# Patient Record
Sex: Male | Born: 1945 | Race: White | Hispanic: No | Marital: Married | Smoking: Never smoker
Health system: Southern US, Community
[De-identification: ages and names within clinical notes are randomized; demographics above are authoritative.]

## PROBLEM LIST (undated history)

## (undated) DIAGNOSIS — M419 Scoliosis, unspecified: Secondary | ICD-10-CM

## (undated) DIAGNOSIS — M1990 Unspecified osteoarthritis, unspecified site: Secondary | ICD-10-CM

## (undated) DIAGNOSIS — E0590 Thyrotoxicosis, unspecified without thyrotoxic crisis or storm: Secondary | ICD-10-CM

## (undated) DIAGNOSIS — G4730 Sleep apnea, unspecified: Secondary | ICD-10-CM

## (undated) DIAGNOSIS — E785 Hyperlipidemia, unspecified: Secondary | ICD-10-CM

## (undated) DIAGNOSIS — I10 Essential (primary) hypertension: Secondary | ICD-10-CM

## (undated) DIAGNOSIS — C801 Malignant (primary) neoplasm, unspecified: Secondary | ICD-10-CM

## (undated) DIAGNOSIS — K219 Gastro-esophageal reflux disease without esophagitis: Secondary | ICD-10-CM

## (undated) DIAGNOSIS — H9190 Unspecified hearing loss, unspecified ear: Secondary | ICD-10-CM

## (undated) HISTORY — DX: Hyperlipidemia, unspecified: E78.5

## (undated) HISTORY — DX: Scoliosis, unspecified: M41.9

## (undated) HISTORY — PX: KNEE ARTHROSCOPY: SUR90

## (undated) HISTORY — DX: Essential (primary) hypertension: I10

## (undated) HISTORY — DX: Malignant (primary) neoplasm, unspecified: C80.1

---

## 2000-03-11 HISTORY — PX: PROSTATECTOMY: SHX69

## 2007-08-21 ENCOUNTER — Encounter: Admission: RE | Admit: 2007-08-21 | Discharge: 2007-08-21 | Payer: Self-pay | Admitting: Unknown Physician Specialty

## 2009-10-02 ENCOUNTER — Encounter: Admission: RE | Admit: 2009-10-02 | Discharge: 2009-10-02 | Payer: Self-pay | Admitting: Unknown Physician Specialty

## 2011-04-04 DIAGNOSIS — H109 Unspecified conjunctivitis: Secondary | ICD-10-CM | POA: Diagnosis not present

## 2011-04-04 DIAGNOSIS — R030 Elevated blood-pressure reading, without diagnosis of hypertension: Secondary | ICD-10-CM | POA: Diagnosis not present

## 2011-04-04 DIAGNOSIS — Z23 Encounter for immunization: Secondary | ICD-10-CM | POA: Diagnosis not present

## 2011-05-02 DIAGNOSIS — R05 Cough: Secondary | ICD-10-CM | POA: Diagnosis not present

## 2011-05-02 DIAGNOSIS — J029 Acute pharyngitis, unspecified: Secondary | ICD-10-CM | POA: Diagnosis not present

## 2011-05-02 DIAGNOSIS — J069 Acute upper respiratory infection, unspecified: Secondary | ICD-10-CM | POA: Diagnosis not present

## 2011-07-04 DIAGNOSIS — H2510 Age-related nuclear cataract, unspecified eye: Secondary | ICD-10-CM | POA: Diagnosis not present

## 2011-07-25 DIAGNOSIS — E0590 Thyrotoxicosis, unspecified without thyrotoxic crisis or storm: Secondary | ICD-10-CM | POA: Diagnosis not present

## 2011-12-05 DIAGNOSIS — R05 Cough: Secondary | ICD-10-CM | POA: Diagnosis not present

## 2011-12-05 DIAGNOSIS — J029 Acute pharyngitis, unspecified: Secondary | ICD-10-CM | POA: Diagnosis not present

## 2011-12-05 DIAGNOSIS — Z23 Encounter for immunization: Secondary | ICD-10-CM | POA: Diagnosis not present

## 2012-03-12 DIAGNOSIS — L282 Other prurigo: Secondary | ICD-10-CM | POA: Diagnosis not present

## 2012-03-12 DIAGNOSIS — L259 Unspecified contact dermatitis, unspecified cause: Secondary | ICD-10-CM | POA: Diagnosis not present

## 2012-06-02 DIAGNOSIS — D235 Other benign neoplasm of skin of trunk: Secondary | ICD-10-CM | POA: Diagnosis not present

## 2012-06-02 DIAGNOSIS — L570 Actinic keratosis: Secondary | ICD-10-CM | POA: Diagnosis not present

## 2012-09-01 DIAGNOSIS — IMO0002 Reserved for concepts with insufficient information to code with codable children: Secondary | ICD-10-CM | POA: Diagnosis not present

## 2012-09-04 DIAGNOSIS — IMO0002 Reserved for concepts with insufficient information to code with codable children: Secondary | ICD-10-CM | POA: Diagnosis not present

## 2012-09-15 DIAGNOSIS — IMO0002 Reserved for concepts with insufficient information to code with codable children: Secondary | ICD-10-CM | POA: Diagnosis not present

## 2012-09-18 DIAGNOSIS — IMO0002 Reserved for concepts with insufficient information to code with codable children: Secondary | ICD-10-CM | POA: Diagnosis not present

## 2012-10-05 DIAGNOSIS — E0510 Thyrotoxicosis with toxic single thyroid nodule without thyrotoxic crisis or storm: Secondary | ICD-10-CM | POA: Diagnosis not present

## 2012-10-12 DIAGNOSIS — M719 Bursopathy, unspecified: Secondary | ICD-10-CM | POA: Diagnosis not present

## 2012-10-12 DIAGNOSIS — M159 Polyosteoarthritis, unspecified: Secondary | ICD-10-CM | POA: Diagnosis not present

## 2012-10-12 DIAGNOSIS — I10 Essential (primary) hypertension: Secondary | ICD-10-CM | POA: Diagnosis not present

## 2012-10-12 DIAGNOSIS — M67919 Unspecified disorder of synovium and tendon, unspecified shoulder: Secondary | ICD-10-CM | POA: Diagnosis not present

## 2012-10-12 DIAGNOSIS — K5730 Diverticulosis of large intestine without perforation or abscess without bleeding: Secondary | ICD-10-CM | POA: Diagnosis not present

## 2012-10-12 DIAGNOSIS — M109 Gout, unspecified: Secondary | ICD-10-CM | POA: Diagnosis not present

## 2012-10-12 DIAGNOSIS — M5137 Other intervertebral disc degeneration, lumbosacral region: Secondary | ICD-10-CM | POA: Diagnosis not present

## 2012-10-12 DIAGNOSIS — M48061 Spinal stenosis, lumbar region without neurogenic claudication: Secondary | ICD-10-CM | POA: Diagnosis not present

## 2012-10-12 DIAGNOSIS — Z23 Encounter for immunization: Secondary | ICD-10-CM | POA: Diagnosis not present

## 2012-10-12 DIAGNOSIS — Z800 Family history of malignant neoplasm of digestive organs: Secondary | ICD-10-CM | POA: Diagnosis not present

## 2012-10-12 DIAGNOSIS — M5106 Intervertebral disc disorders with myelopathy, lumbar region: Secondary | ICD-10-CM | POA: Diagnosis not present

## 2012-12-07 DIAGNOSIS — IMO0002 Reserved for concepts with insufficient information to code with codable children: Secondary | ICD-10-CM | POA: Diagnosis not present

## 2013-01-11 DIAGNOSIS — M5106 Intervertebral disc disorders with myelopathy, lumbar region: Secondary | ICD-10-CM | POA: Diagnosis not present

## 2013-01-11 DIAGNOSIS — M5137 Other intervertebral disc degeneration, lumbosacral region: Secondary | ICD-10-CM | POA: Diagnosis not present

## 2013-01-11 DIAGNOSIS — Z23 Encounter for immunization: Secondary | ICD-10-CM | POA: Diagnosis not present

## 2013-01-11 DIAGNOSIS — I10 Essential (primary) hypertension: Secondary | ICD-10-CM | POA: Diagnosis not present

## 2013-01-11 DIAGNOSIS — M67919 Unspecified disorder of synovium and tendon, unspecified shoulder: Secondary | ICD-10-CM | POA: Diagnosis not present

## 2013-01-11 DIAGNOSIS — M159 Polyosteoarthritis, unspecified: Secondary | ICD-10-CM | POA: Diagnosis not present

## 2013-01-11 DIAGNOSIS — M48061 Spinal stenosis, lumbar region without neurogenic claudication: Secondary | ICD-10-CM | POA: Diagnosis not present

## 2013-01-11 DIAGNOSIS — K5730 Diverticulosis of large intestine without perforation or abscess without bleeding: Secondary | ICD-10-CM | POA: Diagnosis not present

## 2013-04-07 DIAGNOSIS — E0510 Thyrotoxicosis with toxic single thyroid nodule without thyrotoxic crisis or storm: Secondary | ICD-10-CM | POA: Diagnosis not present

## 2013-04-13 DIAGNOSIS — E7800 Pure hypercholesterolemia, unspecified: Secondary | ICD-10-CM | POA: Diagnosis not present

## 2013-04-13 DIAGNOSIS — Z800 Family history of malignant neoplasm of digestive organs: Secondary | ICD-10-CM | POA: Diagnosis not present

## 2013-04-13 DIAGNOSIS — R7309 Other abnormal glucose: Secondary | ICD-10-CM | POA: Diagnosis not present

## 2013-04-13 DIAGNOSIS — I10 Essential (primary) hypertension: Secondary | ICD-10-CM | POA: Diagnosis not present

## 2013-04-13 DIAGNOSIS — M5137 Other intervertebral disc degeneration, lumbosacral region: Secondary | ICD-10-CM | POA: Diagnosis not present

## 2013-04-13 DIAGNOSIS — K5730 Diverticulosis of large intestine without perforation or abscess without bleeding: Secondary | ICD-10-CM | POA: Diagnosis not present

## 2013-04-13 DIAGNOSIS — F4310 Post-traumatic stress disorder, unspecified: Secondary | ICD-10-CM | POA: Diagnosis not present

## 2013-04-13 DIAGNOSIS — M48061 Spinal stenosis, lumbar region without neurogenic claudication: Secondary | ICD-10-CM | POA: Diagnosis not present

## 2013-04-13 DIAGNOSIS — M159 Polyosteoarthritis, unspecified: Secondary | ICD-10-CM | POA: Diagnosis not present

## 2013-04-13 DIAGNOSIS — Z125 Encounter for screening for malignant neoplasm of prostate: Secondary | ICD-10-CM | POA: Diagnosis not present

## 2013-05-12 DIAGNOSIS — Z0100 Encounter for examination of eyes and vision without abnormal findings: Secondary | ICD-10-CM | POA: Diagnosis not present

## 2013-05-12 DIAGNOSIS — H2510 Age-related nuclear cataract, unspecified eye: Secondary | ICD-10-CM | POA: Diagnosis not present

## 2013-09-14 DIAGNOSIS — E0510 Thyrotoxicosis with toxic single thyroid nodule without thyrotoxic crisis or storm: Secondary | ICD-10-CM | POA: Diagnosis not present

## 2013-11-05 DIAGNOSIS — K219 Gastro-esophageal reflux disease without esophagitis: Secondary | ICD-10-CM | POA: Diagnosis not present

## 2013-11-05 DIAGNOSIS — J309 Allergic rhinitis, unspecified: Secondary | ICD-10-CM | POA: Diagnosis not present

## 2013-11-05 DIAGNOSIS — E0590 Thyrotoxicosis, unspecified without thyrotoxic crisis or storm: Secondary | ICD-10-CM | POA: Diagnosis not present

## 2013-11-05 DIAGNOSIS — R7309 Other abnormal glucose: Secondary | ICD-10-CM | POA: Diagnosis not present

## 2013-11-05 DIAGNOSIS — C61 Malignant neoplasm of prostate: Secondary | ICD-10-CM | POA: Diagnosis not present

## 2013-11-05 DIAGNOSIS — M109 Gout, unspecified: Secondary | ICD-10-CM | POA: Diagnosis not present

## 2013-11-05 DIAGNOSIS — E0510 Thyrotoxicosis with toxic single thyroid nodule without thyrotoxic crisis or storm: Secondary | ICD-10-CM | POA: Diagnosis not present

## 2013-11-05 DIAGNOSIS — Z23 Encounter for immunization: Secondary | ICD-10-CM | POA: Diagnosis not present

## 2013-11-05 DIAGNOSIS — E7800 Pure hypercholesterolemia, unspecified: Secondary | ICD-10-CM | POA: Diagnosis not present

## 2013-11-05 DIAGNOSIS — I10 Essential (primary) hypertension: Secondary | ICD-10-CM | POA: Diagnosis not present

## 2013-11-09 DIAGNOSIS — L570 Actinic keratosis: Secondary | ICD-10-CM | POA: Diagnosis not present

## 2013-11-09 DIAGNOSIS — D235 Other benign neoplasm of skin of trunk: Secondary | ICD-10-CM | POA: Diagnosis not present

## 2013-11-10 DIAGNOSIS — M19019 Primary osteoarthritis, unspecified shoulder: Secondary | ICD-10-CM | POA: Diagnosis not present

## 2013-11-10 DIAGNOSIS — M25519 Pain in unspecified shoulder: Secondary | ICD-10-CM | POA: Diagnosis not present

## 2013-12-27 DIAGNOSIS — M19011 Primary osteoarthritis, right shoulder: Secondary | ICD-10-CM | POA: Diagnosis not present

## 2013-12-27 DIAGNOSIS — M25511 Pain in right shoulder: Secondary | ICD-10-CM | POA: Diagnosis not present

## 2014-01-09 HISTORY — PX: SHOULDER SURGERY: SHX246

## 2014-01-09 HISTORY — PX: JOINT REPLACEMENT: SHX530

## 2014-01-19 DIAGNOSIS — E669 Obesity, unspecified: Secondary | ICD-10-CM | POA: Diagnosis present

## 2014-01-19 DIAGNOSIS — Z6832 Body mass index (BMI) 32.0-32.9, adult: Secondary | ICD-10-CM | POA: Diagnosis not present

## 2014-01-19 DIAGNOSIS — E0590 Thyrotoxicosis, unspecified without thyrotoxic crisis or storm: Secondary | ICD-10-CM | POA: Diagnosis present

## 2014-01-19 DIAGNOSIS — Z01812 Encounter for preprocedural laboratory examination: Secondary | ICD-10-CM | POA: Diagnosis not present

## 2014-01-19 DIAGNOSIS — Z7982 Long term (current) use of aspirin: Secondary | ICD-10-CM | POA: Diagnosis not present

## 2014-01-19 DIAGNOSIS — K219 Gastro-esophageal reflux disease without esophagitis: Secondary | ICD-10-CM | POA: Diagnosis present

## 2014-01-19 DIAGNOSIS — Z79899 Other long term (current) drug therapy: Secondary | ICD-10-CM | POA: Diagnosis not present

## 2014-01-19 DIAGNOSIS — M1A9XX1 Chronic gout, unspecified, with tophus (tophi): Secondary | ICD-10-CM | POA: Diagnosis present

## 2014-01-19 DIAGNOSIS — I10 Essential (primary) hypertension: Secondary | ICD-10-CM | POA: Diagnosis present

## 2014-01-19 DIAGNOSIS — G4730 Sleep apnea, unspecified: Secondary | ICD-10-CM | POA: Diagnosis not present

## 2014-01-19 DIAGNOSIS — Z471 Aftercare following joint replacement surgery: Secondary | ICD-10-CM | POA: Diagnosis not present

## 2014-01-19 DIAGNOSIS — D62 Acute posthemorrhagic anemia: Secondary | ICD-10-CM | POA: Diagnosis not present

## 2014-01-19 DIAGNOSIS — E785 Hyperlipidemia, unspecified: Secondary | ICD-10-CM | POA: Diagnosis not present

## 2014-01-19 DIAGNOSIS — M25511 Pain in right shoulder: Secondary | ICD-10-CM | POA: Diagnosis not present

## 2014-01-19 DIAGNOSIS — M19011 Primary osteoarthritis, right shoulder: Secondary | ICD-10-CM | POA: Diagnosis not present

## 2014-02-02 DIAGNOSIS — M75121 Complete rotator cuff tear or rupture of right shoulder, not specified as traumatic: Secondary | ICD-10-CM | POA: Diagnosis not present

## 2014-02-02 DIAGNOSIS — M19011 Primary osteoarthritis, right shoulder: Secondary | ICD-10-CM | POA: Diagnosis not present

## 2014-02-08 DIAGNOSIS — M25511 Pain in right shoulder: Secondary | ICD-10-CM | POA: Diagnosis not present

## 2014-02-09 DIAGNOSIS — M19011 Primary osteoarthritis, right shoulder: Secondary | ICD-10-CM | POA: Diagnosis not present

## 2014-02-11 DIAGNOSIS — M75121 Complete rotator cuff tear or rupture of right shoulder, not specified as traumatic: Secondary | ICD-10-CM | POA: Diagnosis not present

## 2014-02-15 DIAGNOSIS — M19011 Primary osteoarthritis, right shoulder: Secondary | ICD-10-CM | POA: Diagnosis not present

## 2014-02-17 DIAGNOSIS — M19011 Primary osteoarthritis, right shoulder: Secondary | ICD-10-CM | POA: Diagnosis not present

## 2014-02-21 DIAGNOSIS — M19011 Primary osteoarthritis, right shoulder: Secondary | ICD-10-CM | POA: Diagnosis not present

## 2014-02-24 DIAGNOSIS — M19011 Primary osteoarthritis, right shoulder: Secondary | ICD-10-CM | POA: Diagnosis not present

## 2014-03-01 DIAGNOSIS — M19011 Primary osteoarthritis, right shoulder: Secondary | ICD-10-CM | POA: Diagnosis not present

## 2014-03-03 DIAGNOSIS — M19011 Primary osteoarthritis, right shoulder: Secondary | ICD-10-CM | POA: Diagnosis not present

## 2014-03-09 DIAGNOSIS — M19011 Primary osteoarthritis, right shoulder: Secondary | ICD-10-CM | POA: Diagnosis not present

## 2014-03-12 DIAGNOSIS — M19011 Primary osteoarthritis, right shoulder: Secondary | ICD-10-CM | POA: Diagnosis not present

## 2014-03-15 DIAGNOSIS — M19011 Primary osteoarthritis, right shoulder: Secondary | ICD-10-CM | POA: Diagnosis not present

## 2014-03-17 DIAGNOSIS — M19011 Primary osteoarthritis, right shoulder: Secondary | ICD-10-CM | POA: Diagnosis not present

## 2014-03-23 DIAGNOSIS — M19011 Primary osteoarthritis, right shoulder: Secondary | ICD-10-CM | POA: Diagnosis not present

## 2014-03-25 DIAGNOSIS — M19011 Primary osteoarthritis, right shoulder: Secondary | ICD-10-CM | POA: Diagnosis not present

## 2014-03-30 DIAGNOSIS — M19011 Primary osteoarthritis, right shoulder: Secondary | ICD-10-CM | POA: Diagnosis not present

## 2014-04-06 DIAGNOSIS — M19011 Primary osteoarthritis, right shoulder: Secondary | ICD-10-CM | POA: Diagnosis not present

## 2014-04-07 DIAGNOSIS — M4806 Spinal stenosis, lumbar region: Secondary | ICD-10-CM | POA: Diagnosis not present

## 2014-04-07 DIAGNOSIS — M545 Low back pain: Secondary | ICD-10-CM | POA: Diagnosis not present

## 2014-04-07 DIAGNOSIS — M4310 Spondylolisthesis, site unspecified: Secondary | ICD-10-CM | POA: Diagnosis not present

## 2014-04-08 DIAGNOSIS — M19011 Primary osteoarthritis, right shoulder: Secondary | ICD-10-CM | POA: Diagnosis not present

## 2014-04-13 DIAGNOSIS — M19011 Primary osteoarthritis, right shoulder: Secondary | ICD-10-CM | POA: Diagnosis not present

## 2014-04-18 DIAGNOSIS — M5416 Radiculopathy, lumbar region: Secondary | ICD-10-CM | POA: Diagnosis not present

## 2014-04-20 DIAGNOSIS — M4806 Spinal stenosis, lumbar region: Secondary | ICD-10-CM | POA: Diagnosis not present

## 2014-04-26 DIAGNOSIS — M4806 Spinal stenosis, lumbar region: Secondary | ICD-10-CM | POA: Diagnosis not present

## 2014-04-28 DIAGNOSIS — M4806 Spinal stenosis, lumbar region: Secondary | ICD-10-CM | POA: Diagnosis not present

## 2014-05-03 DIAGNOSIS — M4806 Spinal stenosis, lumbar region: Secondary | ICD-10-CM | POA: Diagnosis not present

## 2014-05-06 DIAGNOSIS — M109 Gout, unspecified: Secondary | ICD-10-CM | POA: Diagnosis not present

## 2014-05-06 DIAGNOSIS — I10 Essential (primary) hypertension: Secondary | ICD-10-CM | POA: Diagnosis not present

## 2014-05-06 DIAGNOSIS — R7309 Other abnormal glucose: Secondary | ICD-10-CM | POA: Diagnosis not present

## 2014-05-06 DIAGNOSIS — E780 Pure hypercholesterolemia: Secondary | ICD-10-CM | POA: Diagnosis not present

## 2014-05-06 DIAGNOSIS — E0590 Thyrotoxicosis, unspecified without thyrotoxic crisis or storm: Secondary | ICD-10-CM | POA: Diagnosis not present

## 2014-05-17 DIAGNOSIS — M19011 Primary osteoarthritis, right shoulder: Secondary | ICD-10-CM | POA: Diagnosis not present

## 2014-05-27 DIAGNOSIS — M4806 Spinal stenosis, lumbar region: Secondary | ICD-10-CM | POA: Diagnosis not present

## 2014-06-02 DIAGNOSIS — M19011 Primary osteoarthritis, right shoulder: Secondary | ICD-10-CM | POA: Diagnosis not present

## 2014-06-07 DIAGNOSIS — M19011 Primary osteoarthritis, right shoulder: Secondary | ICD-10-CM | POA: Diagnosis not present

## 2014-07-25 DIAGNOSIS — M19011 Primary osteoarthritis, right shoulder: Secondary | ICD-10-CM | POA: Diagnosis not present

## 2014-07-25 DIAGNOSIS — M25511 Pain in right shoulder: Secondary | ICD-10-CM | POA: Diagnosis not present

## 2014-08-10 DIAGNOSIS — R0981 Nasal congestion: Secondary | ICD-10-CM | POA: Diagnosis not present

## 2014-08-10 DIAGNOSIS — J9801 Acute bronchospasm: Secondary | ICD-10-CM | POA: Diagnosis not present

## 2014-08-10 DIAGNOSIS — J069 Acute upper respiratory infection, unspecified: Secondary | ICD-10-CM | POA: Diagnosis not present

## 2014-09-14 ENCOUNTER — Other Ambulatory Visit: Payer: Self-pay | Admitting: Family Medicine

## 2014-09-14 DIAGNOSIS — J3489 Other specified disorders of nose and nasal sinuses: Secondary | ICD-10-CM

## 2014-09-15 DIAGNOSIS — E0510 Thyrotoxicosis with toxic single thyroid nodule without thyrotoxic crisis or storm: Secondary | ICD-10-CM | POA: Diagnosis not present

## 2014-09-19 ENCOUNTER — Other Ambulatory Visit: Payer: Self-pay | Admitting: Family Medicine

## 2014-09-19 ENCOUNTER — Ambulatory Visit (INDEPENDENT_AMBULATORY_CARE_PROVIDER_SITE_OTHER): Payer: Medicare Other

## 2014-09-19 DIAGNOSIS — J3489 Other specified disorders of nose and nasal sinuses: Secondary | ICD-10-CM

## 2014-10-14 DIAGNOSIS — J324 Chronic pansinusitis: Secondary | ICD-10-CM | POA: Diagnosis not present

## 2014-10-14 DIAGNOSIS — J301 Allergic rhinitis due to pollen: Secondary | ICD-10-CM | POA: Diagnosis not present

## 2014-10-14 DIAGNOSIS — J342 Deviated nasal septum: Secondary | ICD-10-CM | POA: Diagnosis not present

## 2014-11-04 DIAGNOSIS — J322 Chronic ethmoidal sinusitis: Secondary | ICD-10-CM | POA: Diagnosis not present

## 2014-11-04 DIAGNOSIS — J342 Deviated nasal septum: Secondary | ICD-10-CM | POA: Diagnosis not present

## 2014-11-04 DIAGNOSIS — J343 Hypertrophy of nasal turbinates: Secondary | ICD-10-CM | POA: Diagnosis not present

## 2014-11-07 DIAGNOSIS — Z6832 Body mass index (BMI) 32.0-32.9, adult: Secondary | ICD-10-CM | POA: Diagnosis not present

## 2014-11-07 DIAGNOSIS — M4316 Spondylolisthesis, lumbar region: Secondary | ICD-10-CM | POA: Diagnosis not present

## 2014-11-07 DIAGNOSIS — M5136 Other intervertebral disc degeneration, lumbar region: Secondary | ICD-10-CM | POA: Diagnosis not present

## 2014-11-07 DIAGNOSIS — M4126 Other idiopathic scoliosis, lumbar region: Secondary | ICD-10-CM | POA: Diagnosis not present

## 2014-11-07 DIAGNOSIS — M4806 Spinal stenosis, lumbar region: Secondary | ICD-10-CM | POA: Diagnosis not present

## 2014-11-07 DIAGNOSIS — M5416 Radiculopathy, lumbar region: Secondary | ICD-10-CM | POA: Diagnosis not present

## 2014-11-15 ENCOUNTER — Other Ambulatory Visit: Payer: Self-pay | Admitting: Neurosurgery

## 2014-11-15 DIAGNOSIS — M4316 Spondylolisthesis, lumbar region: Secondary | ICD-10-CM

## 2014-11-17 DIAGNOSIS — Z23 Encounter for immunization: Secondary | ICD-10-CM | POA: Diagnosis not present

## 2014-11-17 DIAGNOSIS — Z8546 Personal history of malignant neoplasm of prostate: Secondary | ICD-10-CM | POA: Diagnosis not present

## 2014-11-17 DIAGNOSIS — M4806 Spinal stenosis, lumbar region: Secondary | ICD-10-CM | POA: Diagnosis not present

## 2014-11-17 DIAGNOSIS — E780 Pure hypercholesterolemia: Secondary | ICD-10-CM | POA: Diagnosis not present

## 2014-11-17 DIAGNOSIS — M109 Gout, unspecified: Secondary | ICD-10-CM | POA: Diagnosis not present

## 2014-11-17 DIAGNOSIS — R7309 Other abnormal glucose: Secondary | ICD-10-CM | POA: Diagnosis not present

## 2014-11-17 DIAGNOSIS — I10 Essential (primary) hypertension: Secondary | ICD-10-CM | POA: Diagnosis not present

## 2014-11-23 DIAGNOSIS — H2513 Age-related nuclear cataract, bilateral: Secondary | ICD-10-CM | POA: Diagnosis not present

## 2014-11-23 DIAGNOSIS — H524 Presbyopia: Secondary | ICD-10-CM | POA: Diagnosis not present

## 2014-11-27 ENCOUNTER — Ambulatory Visit
Admission: RE | Admit: 2014-11-27 | Discharge: 2014-11-27 | Disposition: A | Discharge status: Home / Self Care | Payer: Medicare Other | Source: Ambulatory Visit | Attending: Neurosurgery | Admitting: Neurosurgery

## 2014-11-27 DIAGNOSIS — M4806 Spinal stenosis, lumbar region: Secondary | ICD-10-CM | POA: Diagnosis not present

## 2014-11-27 DIAGNOSIS — M4316 Spondylolisthesis, lumbar region: Secondary | ICD-10-CM

## 2014-12-19 ENCOUNTER — Other Ambulatory Visit (HOSPITAL_COMMUNITY): Payer: Self-pay | Admitting: Neurosurgery

## 2014-12-19 DIAGNOSIS — M4806 Spinal stenosis, lumbar region: Secondary | ICD-10-CM | POA: Diagnosis not present

## 2014-12-19 DIAGNOSIS — M4316 Spondylolisthesis, lumbar region: Secondary | ICD-10-CM | POA: Diagnosis not present

## 2014-12-19 DIAGNOSIS — M4126 Other idiopathic scoliosis, lumbar region: Secondary | ICD-10-CM | POA: Diagnosis not present

## 2014-12-19 DIAGNOSIS — M5416 Radiculopathy, lumbar region: Secondary | ICD-10-CM | POA: Diagnosis not present

## 2014-12-19 DIAGNOSIS — M5136 Other intervertebral disc degeneration, lumbar region: Secondary | ICD-10-CM | POA: Diagnosis not present

## 2014-12-22 ENCOUNTER — Other Ambulatory Visit: Payer: Self-pay

## 2014-12-23 ENCOUNTER — Encounter: Payer: Self-pay | Admitting: Vascular Surgery

## 2014-12-23 ENCOUNTER — Telehealth: Payer: Self-pay | Admitting: Vascular Surgery

## 2014-12-23 NOTE — Telephone Encounter (Signed)
Spoke wth pts wife re appt, dpm

## 2014-12-23 NOTE — Telephone Encounter (Signed)
-----   Message from Denman George, RN sent at 12/22/2014 11:54 AM EDT ----- Regarding: needs consult with [facility] Please schedule new pt. appt. with Dr. Scot Dock prior to ALIF scheduled 02/07/15; Dr. Scot Dock is assisting Dr. Vertell Limber.  Please remind pt. to bring copy of CD-Rom of LS spine films to appt. with him.

## 2014-12-29 ENCOUNTER — Other Ambulatory Visit (HOSPITAL_COMMUNITY): Payer: Self-pay | Admitting: Neurosurgery

## 2015-01-04 ENCOUNTER — Encounter (HOSPITAL_COMMUNITY): Payer: Self-pay

## 2015-01-04 ENCOUNTER — Encounter (HOSPITAL_COMMUNITY)
Admission: RE | Admit: 2015-01-04 | Discharge: 2015-01-04 | Disposition: A | Discharge status: Home / Self Care | Payer: Medicare Other | Source: Ambulatory Visit | Attending: Neurosurgery | Admitting: Neurosurgery

## 2015-01-04 DIAGNOSIS — M419 Scoliosis, unspecified: Secondary | ICD-10-CM

## 2015-01-04 DIAGNOSIS — Z0183 Encounter for blood typing: Secondary | ICD-10-CM

## 2015-01-04 DIAGNOSIS — Z01812 Encounter for preprocedural laboratory examination: Secondary | ICD-10-CM | POA: Insufficient documentation

## 2015-01-04 HISTORY — DX: Unspecified osteoarthritis, unspecified site: M19.90

## 2015-01-04 HISTORY — DX: Gastro-esophageal reflux disease without esophagitis: K21.9

## 2015-01-04 HISTORY — DX: Sleep apnea, unspecified: G47.30

## 2015-01-04 HISTORY — DX: Unspecified hearing loss, unspecified ear: H91.90

## 2015-01-04 HISTORY — DX: Thyrotoxicosis, unspecified without thyrotoxic crisis or storm: E05.90

## 2015-01-04 LAB — BASIC METABOLIC PANEL
ANION GAP: 11 (ref 5–15)
BUN: 10 mg/dL (ref 6–20)
CHLORIDE: 102 mmol/L (ref 101–111)
CO2: 24 mmol/L (ref 22–32)
CREATININE: 1.03 mg/dL (ref 0.61–1.24)
Calcium: 9.5 mg/dL (ref 8.9–10.3)
GFR calc non Af Amer: >60 mL/min (ref >60)
Glucose, Bld: 97 mg/dL (ref 65–99)
POTASSIUM: 3.4 mmol/L — AB (ref 3.5–5.1)
SODIUM: 137 mmol/L (ref 135–145)

## 2015-01-04 LAB — CBC
HCT: 43.8 % (ref 39.0–52.0)
HEMOGLOBIN: 15.3 g/dL (ref 13.0–17.0)
MCH: 34.1 pg — AB (ref 26.0–34.0)
MCHC: 34.9 g/dL (ref 30.0–36.0)
MCV: 97.6 fL (ref 78.0–100.0)
Platelets: 172 10*3/uL (ref 150–400)
RBC: 4.49 MIL/uL (ref 4.22–5.81)
RDW: 12.9 % (ref 11.5–15.5)
WBC: 5.9 10*3/uL (ref 4.0–10.5)

## 2015-01-04 LAB — SURGICAL PCR SCREEN
MRSA, PCR: NEGATIVE
Staphylococcus aureus: NEGATIVE

## 2015-01-04 LAB — TYPE AND SCREEN
ABO/RH(D): A POS
ANTIBODY SCREEN: NEGATIVE

## 2015-01-04 LAB — ABO/RH: ABO/RH(D): A POS

## 2015-01-04 MED ORDER — CEFAZOLIN SODIUM-DEXTROSE 2-3 GM-% IV SOLR
2.0000 g | INTRAVENOUS | Status: AC
  Administered 2015-01-05 (×2): 2 g via INTRAVENOUS
  Filled 2015-01-04: qty 50

## 2015-01-04 MED ORDER — CHLORHEXIDINE GLUCONATE 4 % EX LIQD: 60.0000 mL | Freq: Once | CUTANEOUS | Status: DC

## 2015-01-04 NOTE — Pre-Procedure Instructions (Signed)
Derren Suydam  01/04/2015      [id number] - [place], [place] - [address] [place] [address] Phone: [phone] Fax: [phone]    Your procedure is scheduled on Oct 27  Report to [place] at 530 A.M.  Call this number if you have problems the morning of surgery:  [phone]   Remember:  Do not eat food or drink liquids after midnight.  Take these medicines the morning of surgery with A SIP OF WATER allopurinol (Zyloprim), colchicine if needed, omeprazole (Prilosec), methimazole (Tapazole)  Stop taking aspirin, Ibuprofen, BC's, Goody's, Herbal medications, Fish Oil, Aleve    Do not wear jewelry, make-up or nail polish.  Do not wear lotions, powders, or perfumes.  You may wear deodorant.  Do not shave 48 hours prior to surgery.  Men may shave face and neck.  Do not bring valuables to the hospital.  [facility] is not responsible for any belongings or valuables.  Contacts, dentures or bridgework may not be worn into surgery.  Leave your suitcase in the car.  After surgery it may be brought to your room.  For patients admitted to the hospital, discharge time will be determined by your treatment team.  Patients discharged the day of surgery will not be allowed to drive home.  Special instructions:  [facility] - Preparing for Surgery  Before surgery, you can play an important role.  Because skin is not sterile, your skin needs to be as free of germs as possible.  You can reduce the number of germs on you skin by washing with CHG (chlorahexidine gluconate) soap before surgery.  CHG is an antiseptic cleaner which kills germs and bonds with the skin to continue killing germs even after washing.  Please DO NOT use if you have an allergy to CHG or antibacterial soaps.  If your skin becomes reddened/irritated stop using the CHG and inform your nurse when you arrive at Short Stay.  Do not shave  (including legs and underarms) for at least 48 hours prior to the first CHG shower.  You may shave your face.  Please follow these instructions carefully:   1.  Shower with CHG Soap the night before surgery and the   morning of Surgery.  2.  If you choose to wash your hair, wash your hair first as usual with your  normal shampoo.  3.  After you shampoo, rinse your hair and body thoroughly to remove the Shampoo.  4.  Use CHG as you would any other liquid soap.  You can apply chg directly to the skin and wash gently with scrungie or a clean washcloth.  5.  Apply the CHG Soap to your body ONLY FROM THE NECK DOWN.   Do not use on open wounds or open sores.  Avoid contact with your eyes,   ears, mouth and genitals (private parts).  Wash genitals (private parts)  with your normal soap.  6.  Wash thoroughly, paying special attention to the area where your surgery  will be performed.  7.  Thoroughly rinse your body with warm water from the neck down.  8.  DO NOT shower/wash with your normal soap after using and rinsing off  the CHG Soap.  9.  Pat yourself dry with a clean towel.            10.  Wear clean pajamas.  11.  Place clean sheets on your bed the night of your first shower and do not  sleep with pets.  Day of Surgery  Do not apply any lotions/deoderants the morning of surgery.  Please wear clean clothes to the hospital/surgery center.     Please read over the following fact sheets that you were given. Pain Booklet, Coughing and Deep Breathing, Blood Transfusion Information, MRSA Information and Surgical Site Infection Prevention

## 2015-01-04 NOTE — Progress Notes (Signed)
   01/04/15 1217  OBSTRUCTIVE SLEEP APNEA  Have you ever been diagnosed with sleep apnea through a sleep study? No  Do you snore loudly (loud enough to be heard through closed doors)?  1  Do you often feel tired, fatigued, or sleepy during the daytime (such as falling asleep during driving or talking to someone)? 0  Has anyone observed you stop breathing during your sleep? 1  Do you have, or are you being treated for high blood pressure? 1  BMI more than 35 kg/m2? 0  Age > 50 (1-yes) 1  Neck circumference greater than:Male 16 inches or larger, Male 17inches or larger? 1  Male Gender (Yes=1) 1  Obstructive Sleep Apnea Score 6  Score 5 or greater  Results sent to PCP

## 2015-01-04 NOTE — Progress Notes (Addendum)
PCP is Dr Tye [place] Kingsley Callander Denies seeing a cardiologist. Denies ever having a card cath, stress test, or echo. Pt wife at his side states that he is not having anterior exposure by Dr Scot Dock.Lorriane Shire called due to 2 consents noted in epic. Lorriane Shire states that he will not have Dr Scot Dock.  EKG report noted in care everywhere- request sent for tracing, if not back will need EKG on the Day of surgery.

## 2015-01-05 ENCOUNTER — Inpatient Hospital Stay (HOSPITAL_COMMUNITY): Payer: Medicare Other | Admitting: Certified Registered Nurse Anesthetist

## 2015-01-05 ENCOUNTER — Inpatient Hospital Stay (HOSPITAL_COMMUNITY)
Admission: RE | Admit: 2015-01-05 | Discharge: 2015-01-09 | DRG: 460 | Disposition: A | Discharge status: Home / Self Care | Payer: Medicare Other | Source: Ambulatory Visit | Attending: Neurosurgery | Admitting: Neurosurgery

## 2015-01-05 ENCOUNTER — Encounter (HOSPITAL_COMMUNITY): Payer: Self-pay | Admitting: Surgery

## 2015-01-05 ENCOUNTER — Encounter (HOSPITAL_COMMUNITY)
Admission: RE | Disposition: A | Discharge status: Home / Self Care | Payer: Self-pay | Source: Ambulatory Visit | Attending: Neurosurgery

## 2015-01-05 ENCOUNTER — Inpatient Hospital Stay (HOSPITAL_COMMUNITY): Payer: Medicare Other

## 2015-01-05 DIAGNOSIS — Z7982 Long term (current) use of aspirin: Secondary | ICD-10-CM

## 2015-01-05 DIAGNOSIS — T17500A Unspecified foreign body in bronchus causing asphyxiation, initial encounter: Secondary | ICD-10-CM

## 2015-01-05 DIAGNOSIS — T17990A Other foreign object in respiratory tract, part unspecified in causing asphyxiation, initial encounter: Secondary | ICD-10-CM | POA: Diagnosis not present

## 2015-01-05 DIAGNOSIS — M4126 Other idiopathic scoliosis, lumbar region: Secondary | ICD-10-CM | POA: Diagnosis present

## 2015-01-05 DIAGNOSIS — Z9079 Acquired absence of other genital organ(s): Secondary | ICD-10-CM

## 2015-01-05 DIAGNOSIS — M4316 Spondylolisthesis, lumbar region: Secondary | ICD-10-CM | POA: Diagnosis not present

## 2015-01-05 DIAGNOSIS — R0902 Hypoxemia: Secondary | ICD-10-CM | POA: Diagnosis not present

## 2015-01-05 DIAGNOSIS — N320 Bladder-neck obstruction: Secondary | ICD-10-CM | POA: Diagnosis present

## 2015-01-05 DIAGNOSIS — M4806 Spinal stenosis, lumbar region: Principal | ICD-10-CM | POA: Diagnosis present

## 2015-01-05 DIAGNOSIS — Z9119 Patient's noncompliance with other medical treatment and regimen: Secondary | ICD-10-CM | POA: Diagnosis not present

## 2015-01-05 DIAGNOSIS — M5136 Other intervertebral disc degeneration, lumbar region: Secondary | ICD-10-CM | POA: Diagnosis not present

## 2015-01-05 DIAGNOSIS — M549 Dorsalgia, unspecified: Secondary | ICD-10-CM

## 2015-01-05 DIAGNOSIS — M5116 Intervertebral disc disorders with radiculopathy, lumbar region: Secondary | ICD-10-CM | POA: Diagnosis present

## 2015-01-05 DIAGNOSIS — M4326 Fusion of spine, lumbar region: Secondary | ICD-10-CM | POA: Diagnosis not present

## 2015-01-05 DIAGNOSIS — G4733 Obstructive sleep apnea (adult) (pediatric): Secondary | ICD-10-CM | POA: Diagnosis not present

## 2015-01-05 DIAGNOSIS — J9809 Other diseases of bronchus, not elsewhere classified: Secondary | ICD-10-CM

## 2015-01-05 DIAGNOSIS — Z8546 Personal history of malignant neoplasm of prostate: Secondary | ICD-10-CM

## 2015-01-05 DIAGNOSIS — M4186 Other forms of scoliosis, lumbar region: Secondary | ICD-10-CM | POA: Diagnosis not present

## 2015-01-05 DIAGNOSIS — I10 Essential (primary) hypertension: Secondary | ICD-10-CM | POA: Diagnosis present

## 2015-01-05 DIAGNOSIS — Z96611 Presence of right artificial shoulder joint: Secondary | ICD-10-CM | POA: Diagnosis present

## 2015-01-05 DIAGNOSIS — M419 Scoliosis, unspecified: Secondary | ICD-10-CM | POA: Diagnosis present

## 2015-01-05 HISTORY — PX: LUMBAR PERCUTANEOUS PEDICLE SCREW 3 LEVEL: SHX5562

## 2015-01-05 HISTORY — PX: ANTERIOR LATERAL LUMBAR FUSION 4 LEVELS: SHX5552

## 2015-01-05 SURGERY — ANTERIOR LATERAL LUMBAR FUSION 4 LEVELS
Anesthesia: General | Site: Back | Laterality: Left

## 2015-01-05 MED ORDER — ACETAMINOPHEN 10 MG/ML IV SOLN
INTRAVENOUS | Status: AC
  Filled 2015-01-05: qty 100

## 2015-01-05 MED ORDER — FENTANYL CITRATE (PF) 250 MCG/5ML IJ SOLN
INTRAMUSCULAR | Status: AC
  Filled 2015-01-05: qty 5

## 2015-01-05 MED ORDER — MENTHOL 3 MG MT LOZG: 1.0000 | LOZENGE | OROMUCOSAL | Status: DC | PRN

## 2015-01-05 MED ORDER — 0.9 % SODIUM CHLORIDE (POUR BTL) OPTIME
TOPICAL | Status: DC | PRN
  Administered 2015-01-05: 1000 mL

## 2015-01-05 MED ORDER — PANTOPRAZOLE SODIUM 40 MG IV SOLR
40.0000 mg | Freq: Every day | INTRAVENOUS | Status: DC
  Administered 2015-01-05: 40 mg via INTRAVENOUS
  Filled 2015-01-05: qty 40

## 2015-01-05 MED ORDER — ONDANSETRON HCL 4 MG/2ML IJ SOLN
INTRAMUSCULAR | Status: AC
  Filled 2015-01-05: qty 2

## 2015-01-05 MED ORDER — PROPOFOL 500 MG/50ML IV EMUL
INTRAVENOUS | Status: DC | PRN
  Administered 2015-01-05: 75 ug/kg/min via INTRAVENOUS
  Administered 2015-01-05: 12:00:00 via INTRAVENOUS

## 2015-01-05 MED ORDER — LIDOCAINE HCL (CARDIAC) 20 MG/ML IV SOLN
INTRAVENOUS | Status: AC
  Filled 2015-01-05: qty 5

## 2015-01-05 MED ORDER — HYDROMORPHONE HCL 1 MG/ML IJ SOLN
INTRAMUSCULAR | Status: AC
  Filled 2015-01-05: qty 2

## 2015-01-05 MED ORDER — PROPOFOL 10 MG/ML IV BOLUS
INTRAVENOUS | Status: AC
  Filled 2015-01-05: qty 20

## 2015-01-05 MED ORDER — PHENOL 1.4 % MT LIQD
1.0000 | OROMUCOSAL | Status: DC | PRN
  Administered 2015-01-06: 1 via OROMUCOSAL
  Filled 2015-01-05: qty 177

## 2015-01-05 MED ORDER — SUCCINYLCHOLINE CHLORIDE 20 MG/ML IJ SOLN
INTRAMUSCULAR | Status: DC | PRN
  Administered 2015-01-05: 100 mg via INTRAVENOUS

## 2015-01-05 MED ORDER — ACETAMINOPHEN 325 MG PO TABS: 650.0000 mg | ORAL_TABLET | ORAL | Status: DC | PRN

## 2015-01-05 MED ORDER — ACETAMINOPHEN 650 MG RE SUPP: 650.0000 mg | RECTAL | Status: DC | PRN

## 2015-01-05 MED ORDER — PANTOPRAZOLE SODIUM 40 MG PO TBEC
40.0000 mg | DELAYED_RELEASE_TABLET | Freq: Every day | ORAL | Status: DC
  Administered 2015-01-06: 40 mg via ORAL
  Filled 2015-01-05: qty 1

## 2015-01-05 MED ORDER — METHOCARBAMOL 500 MG PO TABS
500.0000 mg | ORAL_TABLET | Freq: Four times a day (QID) | ORAL | Status: DC | PRN
  Administered 2015-01-08 – 2015-01-09 (×2): 500 mg via ORAL
  Filled 2015-01-05 (×3): qty 1

## 2015-01-05 MED ORDER — SODIUM CHLORIDE 0.9 % IV SOLN
250.0000 mL | INTRAVENOUS | Status: DC
Start: 2015-01-05 — End: 2015-01-09

## 2015-01-05 MED ORDER — DOCUSATE SODIUM 100 MG PO CAPS
100.0000 mg | ORAL_CAPSULE | Freq: Two times a day (BID) | ORAL | Status: DC
  Administered 2015-01-06 – 2015-01-09 (×7): 100 mg via ORAL
  Filled 2015-01-05 (×8): qty 1

## 2015-01-05 MED ORDER — ALUM & MAG HYDROXIDE-SIMETH 200-200-20 MG/5ML PO SUSP: 30.0000 mL | Freq: Four times a day (QID) | ORAL | Status: DC | PRN

## 2015-01-05 MED ORDER — EPHEDRINE SULFATE 50 MG/ML IJ SOLN
INTRAMUSCULAR | Status: DC | PRN
  Administered 2015-01-05 (×2): 5 mg via INTRAVENOUS
  Administered 2015-01-05: 10 mg via INTRAVENOUS
  Administered 2015-01-05 (×3): 5 mg via INTRAVENOUS

## 2015-01-05 MED ORDER — LACTATED RINGERS IV SOLN
INTRAVENOUS | Status: DC | PRN
  Administered 2015-01-05 (×3): via INTRAVENOUS

## 2015-01-05 MED ORDER — LISINOPRIL 20 MG PO TABS
20.0000 mg | ORAL_TABLET | Freq: Every day | ORAL | Status: DC
  Administered 2015-01-06: 20 mg via ORAL
  Filled 2015-01-05: qty 1

## 2015-01-05 MED ORDER — HYDROMORPHONE HCL 1 MG/ML IJ SOLN
0.2500 mg | INTRAMUSCULAR | Status: DC | PRN
  Administered 2015-01-05 (×3): 0.5 mg via INTRAVENOUS

## 2015-01-05 MED ORDER — SODIUM CHLORIDE 0.9 % IJ SOLN
3.0000 mL | Freq: Two times a day (BID) | INTRAMUSCULAR | Status: DC
  Administered 2015-01-05 – 2015-01-08 (×5): 3 mL via INTRAVENOUS

## 2015-01-05 MED ORDER — PROMETHAZINE HCL 25 MG/ML IJ SOLN: 6.2500 mg | INTRAMUSCULAR | Status: DC | PRN

## 2015-01-05 MED ORDER — STERILE WATER FOR INJECTION IJ SOLN
INTRAMUSCULAR | Status: AC
  Filled 2015-01-05: qty 10

## 2015-01-05 MED ORDER — METHIMAZOLE 10 MG PO TABS
10.0000 mg | ORAL_TABLET | Freq: Every day | ORAL | Status: DC
  Administered 2015-01-06 – 2015-01-09 (×4): 10 mg via ORAL
  Filled 2015-01-05 (×6): qty 1

## 2015-01-05 MED ORDER — POLYETHYLENE GLYCOL 3350 17 G PO PACK
17.0000 g | PACK | Freq: Every day | ORAL | Status: DC | PRN
  Filled 2015-01-05: qty 1

## 2015-01-05 MED ORDER — KCL IN DEXTROSE-NACL 20-5-0.45 MEQ/L-%-% IV SOLN
INTRAVENOUS | Status: DC
  Filled 2015-01-05 (×9): qty 1000

## 2015-01-05 MED ORDER — EPHEDRINE SULFATE 50 MG/ML IJ SOLN
INTRAMUSCULAR | Status: AC
  Filled 2015-01-05: qty 1

## 2015-01-05 MED ORDER — HEMOSTATIC AGENTS (NO CHARGE) OPTIME
TOPICAL | Status: DC | PRN
  Administered 2015-01-05: 1 via TOPICAL

## 2015-01-05 MED ORDER — MIDAZOLAM HCL 5 MG/5ML IJ SOLN
INTRAMUSCULAR | Status: DC | PRN
  Administered 2015-01-05: 2 mg via INTRAVENOUS

## 2015-01-05 MED ORDER — OXYCODONE-ACETAMINOPHEN 5-325 MG PO TABS
1.0000 | ORAL_TABLET | ORAL | Status: DC | PRN
  Administered 2015-01-06 (×5): 1 via ORAL
  Administered 2015-01-07 – 2015-01-09 (×13): 2 via ORAL
  Filled 2015-01-05: qty 1
  Filled 2015-01-05 (×2): qty 2
  Filled 2015-01-05: qty 1
  Filled 2015-01-05 (×3): qty 2
  Filled 2015-01-05: qty 1
  Filled 2015-01-05 (×6): qty 2
  Filled 2015-01-05: qty 1
  Filled 2015-01-05: qty 2
  Filled 2015-01-05: qty 1
  Filled 2015-01-05 (×2): qty 2
  Filled 2015-01-05: qty 1

## 2015-01-05 MED ORDER — HYDROCODONE-ACETAMINOPHEN 5-325 MG PO TABS: 1.0000 | ORAL_TABLET | ORAL | Status: DC | PRN

## 2015-01-05 MED ORDER — FLEET ENEMA 7-19 GM/118ML RE ENEM: 1.0000 | ENEMA | Freq: Once | RECTAL | Status: DC | PRN

## 2015-01-05 MED ORDER — MIDAZOLAM HCL 2 MG/2ML IJ SOLN
INTRAMUSCULAR | Status: AC
  Filled 2015-01-05: qty 4

## 2015-01-05 MED ORDER — ZOLPIDEM TARTRATE 5 MG PO TABS
5.0000 mg | ORAL_TABLET | Freq: Every evening | ORAL | Status: DC | PRN
  Administered 2015-01-06 – 2015-01-08 (×3): 5 mg via ORAL
  Filled 2015-01-05 (×3): qty 1

## 2015-01-05 MED ORDER — DEXAMETHASONE SODIUM PHOSPHATE 10 MG/ML IJ SOLN
6.0000 mg | Freq: Once | INTRAMUSCULAR | Status: AC
  Administered 2015-01-05: 6 mg via INTRAVENOUS
  Filled 2015-01-05: qty 1

## 2015-01-05 MED ORDER — ASPIRIN EC 81 MG PO TBEC
81.0000 mg | DELAYED_RELEASE_TABLET | Freq: Every day | ORAL | Status: DC
  Administered 2015-01-06 – 2015-01-07 (×2): 81 mg via ORAL
  Filled 2015-01-05 (×5): qty 1

## 2015-01-05 MED ORDER — FENTANYL CITRATE (PF) 100 MCG/2ML IJ SOLN
INTRAMUSCULAR | Status: DC | PRN
Start: 2015-01-05 — End: 2015-01-05
  Administered 2015-01-05: 100 ug via INTRAVENOUS
  Administered 2015-01-05 (×3): 50 ug via INTRAVENOUS
  Administered 2015-01-05: 100 ug via INTRAVENOUS
  Administered 2015-01-05 (×3): 50 ug via INTRAVENOUS

## 2015-01-05 MED ORDER — BUPIVACAINE HCL (PF) 0.5 % IJ SOLN
INTRAMUSCULAR | Status: DC | PRN
  Administered 2015-01-05: 9 mL

## 2015-01-05 MED ORDER — ARTIFICIAL TEARS OP OINT
TOPICAL_OINTMENT | OPHTHALMIC | Status: AC
  Filled 2015-01-05: qty 3.5

## 2015-01-05 MED ORDER — BISACODYL 10 MG RE SUPP: 10.0000 mg | Freq: Every day | RECTAL | Status: DC | PRN

## 2015-01-05 MED ORDER — ACETAMINOPHEN 10 MG/ML IV SOLN
1000.0000 mg | Freq: Four times a day (QID) | INTRAVENOUS | Status: DC
  Administered 2015-01-05: 1000 mg via INTRAVENOUS

## 2015-01-05 MED ORDER — SIMVASTATIN 20 MG PO TABS
20.0000 mg | ORAL_TABLET | Freq: Every day | ORAL | Status: DC
  Administered 2015-01-06 – 2015-01-09 (×4): 20 mg via ORAL
  Filled 2015-01-05 (×4): qty 1

## 2015-01-05 MED ORDER — PROPOFOL 10 MG/ML IV BOLUS
INTRAVENOUS | Status: DC | PRN
  Administered 2015-01-05: 200 mg via INTRAVENOUS

## 2015-01-05 MED ORDER — HYDROMORPHONE HCL 1 MG/ML IJ SOLN
0.5000 mg | INTRAMUSCULAR | Status: DC | PRN
  Administered 2015-01-05 – 2015-01-06 (×6): 1 mg via INTRAVENOUS
  Filled 2015-01-05 (×6): qty 1

## 2015-01-05 MED ORDER — HYDROMORPHONE HCL 1 MG/ML IJ SOLN
INTRAMUSCULAR | Status: AC
  Filled 2015-01-05: qty 1

## 2015-01-05 MED ORDER — INDAPAMIDE 2.5 MG PO TABS
2.5000 mg | ORAL_TABLET | Freq: Every morning | ORAL | Status: DC
  Administered 2015-01-06 – 2015-01-09 (×4): 2.5 mg via ORAL
  Filled 2015-01-05 (×5): qty 1

## 2015-01-05 MED ORDER — SODIUM CHLORIDE 0.9 % IJ SOLN
3.0000 mL | INTRAMUSCULAR | Status: DC | PRN
  Administered 2015-01-08: 3 mL via INTRAVENOUS
  Filled 2015-01-05: qty 3

## 2015-01-05 MED ORDER — ACETYLCYSTEINE 20 % IN SOLN
600.0000 mg | Freq: Once | RESPIRATORY_TRACT | Status: AC
  Administered 2015-01-05: 600 mg via RESPIRATORY_TRACT
  Filled 2015-01-05: qty 4

## 2015-01-05 MED ORDER — MEPERIDINE HCL 25 MG/ML IJ SOLN: 6.2500 mg | INTRAMUSCULAR | Status: DC | PRN

## 2015-01-05 MED ORDER — LIDOCAINE HCL (CARDIAC) 20 MG/ML IV SOLN
INTRAVENOUS | Status: DC | PRN
  Administered 2015-01-05: 50 mg via INTRAVENOUS
  Administered 2015-01-05: 50 mg via INTRATRACHEAL

## 2015-01-05 MED ORDER — ALLOPURINOL 100 MG PO TABS
300.0000 mg | ORAL_TABLET | Freq: Every day | ORAL | Status: DC
  Administered 2015-01-06 – 2015-01-09 (×4): 300 mg via ORAL
  Filled 2015-01-05 (×4): qty 3

## 2015-01-05 MED ORDER — ONDANSETRON HCL 4 MG/2ML IJ SOLN
INTRAMUSCULAR | Status: DC | PRN
  Administered 2015-01-05: 4 mg via INTRAVENOUS

## 2015-01-05 MED ORDER — ONDANSETRON HCL 4 MG/2ML IJ SOLN
4.0000 mg | INTRAMUSCULAR | Status: DC | PRN
  Administered 2015-01-05 – 2015-01-06 (×5): 4 mg via INTRAVENOUS
  Filled 2015-01-05 (×4): qty 2

## 2015-01-05 MED ORDER — LIDOCAINE-EPINEPHRINE 1 %-1:100000 IJ SOLN
INTRAMUSCULAR | Status: DC | PRN
Start: 2015-01-05 — End: 2015-01-05
  Administered 2015-01-05: 9 mL

## 2015-01-05 MED ORDER — THROMBIN 5000 UNITS EX SOLR
CUTANEOUS | Status: DC | PRN
  Administered 2015-01-05 (×2): 5000 [IU] via TOPICAL

## 2015-01-05 MED ORDER — ROCURONIUM BROMIDE 50 MG/5ML IV SOLN
INTRAVENOUS | Status: AC
  Filled 2015-01-05: qty 1

## 2015-01-05 MED ORDER — CEFAZOLIN SODIUM-DEXTROSE 2-3 GM-% IV SOLR
2.0000 g | Freq: Three times a day (TID) | INTRAVENOUS | Status: AC
  Administered 2015-01-05 – 2015-01-06 (×2): 2 g via INTRAVENOUS
  Filled 2015-01-05 (×2): qty 50

## 2015-01-05 MED ORDER — BUPIVACAINE LIPOSOME 1.3 % IJ SUSP
20.0000 mL | INTRAMUSCULAR | Status: AC
  Filled 2015-01-05: qty 20

## 2015-01-05 MED ORDER — BUPIVACAINE LIPOSOME 1.3 % IJ SUSP
INTRAMUSCULAR | Status: DC | PRN
  Administered 2015-01-05: 20 mL

## 2015-01-05 MED ORDER — METHOCARBAMOL 1000 MG/10ML IJ SOLN
500.0000 mg | Freq: Four times a day (QID) | INTRAVENOUS | Status: DC | PRN
  Administered 2015-01-05: 500 mg via INTRAVENOUS
  Filled 2015-01-05 (×2): qty 5

## 2015-01-05 SURGICAL SUPPLY — 68 items
BLADE CLIPPER SURG (BLADE) IMPLANT
CAGE COROENT 8X22X60 (Cage) ×4 IMPLANT
CLIP NEUROVISION LG (CLIP) ×4 IMPLANT
COROENT X/W 10X22X60 (Trauma) ×8 IMPLANT
COVER BACK TABLE 24X17X13 BIG (DRAPES) IMPLANT
DECANTER SPIKE VIAL GLASS SM (MISCELLANEOUS) ×4 IMPLANT
DERMABOND ADHESIVE PROPEN (GAUZE/BANDAGES/DRESSINGS) ×6
DERMABOND ADVANCED (GAUZE/BANDAGES/DRESSINGS) ×4
DERMABOND ADVANCED .7 DNX12 (GAUZE/BANDAGES/DRESSINGS) ×4 IMPLANT
DERMABOND ADVANCED .7 DNX6 (GAUZE/BANDAGES/DRESSINGS) ×6 IMPLANT
DRAPE C-ARM 42X72 X-RAY (DRAPES) ×12 IMPLANT
DRAPE C-ARMOR (DRAPES) ×8 IMPLANT
DRAPE LAPAROTOMY 100X72X124 (DRAPES) ×8 IMPLANT
DRAPE POUCH INSTRU U-SHP 10X18 (DRAPES) ×8 IMPLANT
DRSG OPSITE POSTOP 3X4 (GAUZE/BANDAGES/DRESSINGS) ×4 IMPLANT
DRSG OPSITE POSTOP 4X6 (GAUZE/BANDAGES/DRESSINGS) ×4 IMPLANT
DRSG OPSITE POSTOP 4X8 (GAUZE/BANDAGES/DRESSINGS) ×8 IMPLANT
DURAPREP 26ML APPLICATOR (WOUND CARE) ×8 IMPLANT
ELECT REM PT RETURN 9FT ADLT (ELECTROSURGICAL) ×4
ELECTRODE REM PT RTRN 9FT ADLT (ELECTROSURGICAL) ×2 IMPLANT
GAUZE SPONGE 4X4 16PLY XRAY LF (GAUZE/BANDAGES/DRESSINGS) ×4 IMPLANT
GLOVE BIO SURGEON STRL SZ8 (GLOVE) ×8 IMPLANT
GLOVE BIOGEL PI IND STRL 8 (GLOVE) ×4 IMPLANT
GLOVE BIOGEL PI IND STRL 8.5 (GLOVE) ×4 IMPLANT
GLOVE BIOGEL PI INDICATOR 8 (GLOVE) ×4
GLOVE BIOGEL PI INDICATOR 8.5 (GLOVE) ×4
GLOVE ECLIPSE 8.0 STRL XLNG CF (GLOVE) ×8 IMPLANT
GLOVE EXAM NITRILE LRG STRL (GLOVE) IMPLANT
GLOVE EXAM NITRILE MD LF STRL (GLOVE) IMPLANT
GLOVE EXAM NITRILE XL STR (GLOVE) IMPLANT
GLOVE EXAM NITRILE XS STR PU (GLOVE) IMPLANT
GLOVE INDICATOR 7.0 STRL GRN (GLOVE) ×16 IMPLANT
GLOVE INDICATOR 7.5 STRL GRN (GLOVE) ×8 IMPLANT
GOWN STRL REUS W/ TWL LRG LVL3 (GOWN DISPOSABLE) ×2 IMPLANT
GOWN STRL REUS W/ TWL XL LVL3 (GOWN DISPOSABLE) ×4 IMPLANT
GOWN STRL REUS W/TWL 2XL LVL3 (GOWN DISPOSABLE) ×8 IMPLANT
GOWN STRL REUS W/TWL LRG LVL3 (GOWN DISPOSABLE) ×2
GOWN STRL REUS W/TWL XL LVL3 (GOWN DISPOSABLE) ×4
GUIDEWIRE NITINOL BEVEL TIP (WIRE) ×32 IMPLANT
KIT BASIN OR (CUSTOM PROCEDURE TRAY) ×8 IMPLANT
KIT DILATOR XLIF 5 (KITS) ×2 IMPLANT
KIT INFUSE MEDIUM (Orthopedic Implant) ×4 IMPLANT
KIT NEEDLE NVM5 EMG ELECT (KITS) ×2 IMPLANT
KIT NEEDLE NVM5 EMG ELECTRODE (KITS) ×2
KIT ROOM TURNOVER OR (KITS) ×8 IMPLANT
KIT SURGICAL ACCESS MAXCESS 4 (KITS) ×4 IMPLANT
KIT XLIF (KITS) ×2
NEEDLE HYPO 25X1 1.5 SAFETY (NEEDLE) ×8 IMPLANT
NEEDLE I PASS (NEEDLE) ×4 IMPLANT
NS IRRIG 1000ML POUR BTL (IV SOLUTION) ×4 IMPLANT
PACK FOAM VITOSS 10CC (Orthopedic Implant) ×8 IMPLANT
PACK LAMINECTOMY NEURO (CUSTOM PROCEDURE TRAY) ×8 IMPLANT
ROD RELINE MAS LORD 5.5X110MM (Rod) ×8 IMPLANT
SCREW LOCK RELINE 5.5 TULIP (Screw) ×32 IMPLANT
SCREW MAS RELINE 6.5X45 POLY (Screw) ×24 IMPLANT
SCREW MAS RELINE 6.5X50 POLY (Screw) ×8 IMPLANT
SPONGE LAP 4X18 X RAY DECT (DISPOSABLE) ×4 IMPLANT
SPONGE SURGIFOAM ABS GEL SZ50 (HEMOSTASIS) IMPLANT
STAPLER SKIN PROX WIDE 3.9 (STAPLE) ×4 IMPLANT
SUT VIC AB 1 CT1 18XBRD ANBCTR (SUTURE) ×4 IMPLANT
SUT VIC AB 1 CT1 8-18 (SUTURE) ×4
SUT VIC AB 2-0 CT1 18 (SUTURE) ×8 IMPLANT
SUT VIC AB 3-0 SH 8-18 (SUTURE) ×8 IMPLANT
TAPE CLOTH 3X10 TAN LF (GAUZE/BANDAGES/DRESSINGS) ×12 IMPLANT
TOWEL OR 17X24 6PK STRL BLUE (TOWEL DISPOSABLE) ×4 IMPLANT
TOWEL OR 17X26 10 PK STRL BLUE (TOWEL DISPOSABLE) ×8 IMPLANT
TRAY FOLEY W/METER SILVER 14FR (SET/KITS/TRAYS/PACK) ×4 IMPLANT
WATER STERILE IRR 1000ML POUR (IV SOLUTION) ×8 IMPLANT

## 2015-01-05 NOTE — Brief Op Note (Signed)
01/05/2015  1:21 PM  PATIENT:  Julian Moran  69 y.o. male  PRE-OPERATIVE DIAGNOSIS:  scoliosis - stenosis - DDD, spondylolisthesis, radiculopathy  POST-OPERATIVE DIAGNOSIS:  scoliosis - stenosis - DDD, spondylolisthesis, radiculopathy  PROCEDURE:  Procedure(s) with comments: Anterior lateral lumbar fusion lumbar two-lumbar three, lumbar three-lumbar four, lumbar four-lumbar five left with percutaneous pedicle screws lumbar two through five (Left) - Anterior lateral lumbar fusion lumbar two-lumbar three, lumbar three-lumbar four, lumbar four-lumbar five left with percutaneous pedicle screws lumbar two through five LUMBAR PERCUTANEOUS PEDICLE SCREW 3 LEVEL  SURGEON:  Surgeon(s) and Role:    * Erline Levine, MD - Primary    * Ashok Pall, MD - Assisting  PHYSICIAN ASSISTANT:   ASSISTANTS: Poteat, RN   ANESTHESIA:   general  EBL:  Total I/O In: 2500 [I.V.:2500] Out: 140 [Urine:140]  BLOOD ADMINISTERED:none  DRAINS: none   LOCAL MEDICATIONS USED:  MARCAINE     SPECIMEN:  No Specimen  DISPOSITION OF SPECIMEN:  N/A  COUNTS:  YES  TOURNIQUET:  * No tourniquets in log *  DICTATION: Patient is a 69 year old with 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 It was elected to taken to surgery for anterolateral decompression and posterior pedicle screw fixation.  Procedure: Patient was brought to the operating room and placed in a left lateral decubitus position after the induction of GETA and placement of Foley catheter by Dr. Karsten Ro as we were unable to place a catheter ourselves, on the operative table and using orthogonally projected C-arm fluoroscopy the patient was placed so that the L2-3 L3-4 and L4-5 levels were visualized in AP and lateral plane. The patient was then taped into position. The table was flexed so as to expose the L4-5 level as the patient has a high iliac crest. Skin was marked along with a posterior finger dissection incision. His flank  was then prepped and draped in usual sterile fashion and incisions were made sequentially at L4-5 L3-4 and L2-3 levels. Posterior finger dissection was made to enter the retroperitoneal space and then subsequently the probe was inserted into the psoas muscle from the left side initially at the L4-5 level. After mapping the neural elements were able to dock the probe per the midpoint of this vertebral level and without indications electrically of too close proximity to the neural tissues. Subsequently the self-retaining tractor was.after sequential dilators were utilized the shim was employed and the interspace was cleared of psoas muscle and then incised. A thorough discectomy was performed. Angled instruments were used to clear the interspace of disc material. After thorough discectomy was performed and this was performed using AP and lateral fluoroscopy a 10 lordotic by 60 x 22 mm implant was packed with BMP and Vitoss. This was tamped into position using the slides and its position was confirmed on AP and lateral fluoroscopy. Retractor time at the L 23 level was 17 minutes, at L 34 12 minutes, and 16 minutes at L 45.  Subsequently exposure was performed at the L3-4 level and similar dissection was performed with locking of the self-retaining retractor. At this level were able to place a 10 lordotic by 22 x 60 mm implant packed in a similar fashion. At the L2-3 level were able to place an 8 mm standard by 60 x 22 x 8 standard implant packed in a similar fashion. Hemostasis was assured the wounds were irrigated and closed with interrupted Vicryl sutures.  Sterile occlusive dressings were placed.  Patient was then turned into  a prone position on the [place] table and using AP and lateral fluoroscopy throughout this portion of the procedure, pedicle screws were placed using Nuvasive cannulated percutaneous screws. 2 screws were placed at L2 and (6.5 x 45 mm) and 2 at L3 and two at L4 of a similar size and 2 at L5  of similar size. 110 mm rods were then affixed to the screw heads do a separate stab incision and locked down on the screws. All connections were then torqued and the Towers were disassembled. The wounds were irrigated and then closed with 1, 2-0 and 3-0 Vicryl stitches. Sterile occlusive dressing was placed with Dermabond and an occlusive dressing. The patient was then extubated in the operating room and taken to recovery in stable and satisfactory condition having tolerated his operation well. Counts were correct at the end of the case.  Patient was extubated and taken to Recovery in stable condition, having tolerated his surgery well.    PLAN OF CARE: Admit to inpatient   PATIENT DISPOSITION:  PACU - hemodynamically stable.   Delay start of Pharmacological VTE agent (>24hrs) due to surgical blood loss or risk of bleeding: yes

## 2015-01-05 NOTE — Progress Notes (Signed)
Patient ID: Julian Moran, male   DOB: September 22, 1945, 69 y.o.   MRN: [id number]  BP 150/77 mmHg  Pulse 94  Temp(Src) 97.4 F (36.3 C) (Oral)  Resp 16  SpO2 99% Called to see Mr. Smoker for respiratory distress. He felt as if he could not breathe, and became cyanotic according to his wife. There was no bradycardia during this time.  He was suctioned and his saturation improved.  He is alert and following commands I have prescribed decadron, mucomyst, and nasotracheal sucitioning.

## 2015-01-05 NOTE — Anesthesia Preprocedure Evaluation (Addendum)
Anesthesia Evaluation  Patient identified by MRN, date of birth, ID band Patient awake    Reviewed: Allergy & Precautions, NPO status , Patient's Chart, lab work & pertinent test results  Airway Mallampati: II  TM Distance: >3 FB Neck ROM: Full    Dental no notable dental hx.    Pulmonary sleep apnea ,    Pulmonary exam normal breath sounds clear to auscultation       Cardiovascular hypertension, Pt. on medications Normal cardiovascular exam Rhythm:Regular Rate:Normal     Neuro/Psych negative neurological ROS  negative psych ROS   GI/Hepatic Neg liver ROS, GERD  ,  Endo/Other  Hyperthyroidism   Renal/GU negative Renal ROS     Musculoskeletal  (+) Arthritis ,   Abdominal   Peds  Hematology negative hematology ROS (+)   Anesthesia Other Findings   Reproductive/Obstetrics negative OB ROS                            Anesthesia Physical Anesthesia Plan  ASA: III  Anesthesia Plan: General   Post-op Pain Management:    Induction: Intravenous  Airway Management Planned: Oral ETT  Additional Equipment:   Intra-op Plan:   Post-operative Plan: Extubation in OR  Informed Consent: I have reviewed the patients History and Physical, chart, labs and discussed the procedure including the risks, benefits and alternatives for the proposed anesthesia with the patient or authorized representative who has indicated his/her understanding and acceptance.   Dental advisory given  Plan Discussed with: CRNA  Anesthesia Plan Comments:         Anesthesia Quick Evaluation

## 2015-01-05 NOTE — Op Note (Signed)
01/05/2015  1:21 PM  PATIENT:  Julian Moran  69 y.o. male  PRE-OPERATIVE DIAGNOSIS:  scoliosis - stenosis - DDD, spondylolisthesis, radiculopathy  POST-OPERATIVE DIAGNOSIS:  scoliosis - stenosis - DDD, spondylolisthesis, radiculopathy  PROCEDURE:  Procedure(s) with comments: Anterior lateral lumbar fusion lumbar two-lumbar three, lumbar three-lumbar four, lumbar four-lumbar five left with percutaneous pedicle screws lumbar two through five (Left) - Anterior lateral lumbar fusion lumbar two-lumbar three, lumbar three-lumbar four, lumbar four-lumbar five left with percutaneous pedicle screws lumbar two through five LUMBAR PERCUTANEOUS PEDICLE SCREW 3 LEVEL  SURGEON:  Surgeon(s) and Role:    * Erline Levine, MD - Primary    * Ashok Pall, MD - Assisting  PHYSICIAN ASSISTANT:   ASSISTANTS: Poteat, RN   ANESTHESIA:   general  EBL:  Total I/O In: 2500 [I.V.:2500] Out: 140 [Urine:140]  BLOOD ADMINISTERED:none  DRAINS: none   LOCAL MEDICATIONS USED:  MARCAINE     SPECIMEN:  No Specimen  DISPOSITION OF SPECIMEN:  N/A  COUNTS:  YES  TOURNIQUET:  * No tourniquets in log *  DICTATION: Patient is a 69 year old with 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 It was elected to taken to surgery for anterolateral decompression and posterior pedicle screw fixation.  Procedure: Patient was brought to the operating room and placed in a left lateral decubitus position after the induction of GETA and placement of Foley catheter by Dr. Karsten Ro as we were unable to place a catheter ourselves, on the operative table and using orthogonally projected C-arm fluoroscopy the patient was placed so that the L2-3 L3-4 and L4-5 levels were visualized in AP and lateral plane. The patient was then taped into position. The table was flexed so as to expose the L4-5 level as the patient has a high iliac crest. Skin was marked along with a posterior finger dissection incision. His flank  was then prepped and draped in usual sterile fashion and incisions were made sequentially at L4-5 L3-4 and L2-3 levels. Posterior finger dissection was made to enter the retroperitoneal space and then subsequently the probe was inserted into the psoas muscle from the left side initially at the L4-5 level. After mapping the neural elements were able to dock the probe per the midpoint of this vertebral level and without indications electrically of too close proximity to the neural tissues. Subsequently the self-retaining tractor was.after sequential dilators were utilized the shim was employed and the interspace was cleared of psoas muscle and then incised. A thorough discectomy was performed. Angled instruments were used to clear the interspace of disc material. After thorough discectomy was performed and this was performed using AP and lateral fluoroscopy a 10 lordotic by 60 x 22 mm implant was packed with BMP and Vitoss. This was tamped into position using the slides and its position was confirmed on AP and lateral fluoroscopy. Retractor time at the L 23 level was 17 minutes, at L 34 12 minutes, and 16 minutes at L 45.  Subsequently exposure was performed at the L3-4 level and similar dissection was performed with locking of the self-retaining retractor. At this level were able to place a 10 lordotic by 22 x 60 mm implant packed in a similar fashion. At the L2-3 level were able to place an 8 mm standard by 60 x 22 x 8 standard implant packed in a similar fashion. Hemostasis was assured the wounds were irrigated and closed with interrupted Vicryl sutures.  Sterile occlusive dressings were placed.  Patient was then turned into  a prone position on the [place] table and using AP and lateral fluoroscopy throughout this portion of the procedure, pedicle screws were placed using Nuvasive cannulated percutaneous screws. 2 screws were placed at L2 and (6.5 x 45 mm) and 2 at L3 and two at L4 of a similar size and 2 at L5  of similar size. 110 mm rods were then affixed to the screw heads do a separate stab incision and locked down on the screws. All connections were then torqued and the Towers were disassembled. The wounds were irrigated and then closed with 1, 2-0 and 3-0 Vicryl stitches. Sterile occlusive dressing was placed with Dermabond and an occlusive dressing. The patient was then extubated in the operating room and taken to recovery in stable and satisfactory condition having tolerated his operation well. Counts were correct at the end of the case.  Patient was extubated and taken to Recovery in stable condition, having tolerated his surgery well.    PLAN OF CARE: Admit to inpatient   PATIENT DISPOSITION:  PACU - hemodynamically stable.   Delay start of Pharmacological VTE agent (>24hrs) due to surgical blood loss or risk of bleeding: yes

## 2015-01-05 NOTE — Anesthesia Procedure Notes (Signed)
Procedure Name: Intubation Date/Time: 01/05/2015 7:43 AM Performed by: [place] Pink Pre-anesthesia Checklist: Patient identified, Emergency Drugs available, Suction available, Patient being monitored and Timeout performed Patient Re-evaluated:Patient Re-evaluated prior to inductionOxygen Delivery Method: Circle system utilized Preoxygenation: Pre-oxygenation with 100% oxygen Intubation Type: IV induction Ventilation: Mask ventilation without difficulty and Oral airway inserted - appropriate to patient size Laryngoscope Size: Mac and 4 Grade View: Grade I Tube type: Oral Tube size: 7.5 mm Number of attempts: 1 Airway Equipment and Method: Stylet and LTA kit utilized Placement Confirmation: ETT inserted through vocal cords under direct vision,  positive ETCO2 and breath sounds checked- equal and bilateral Secured at: 23 cm Tube secured with: Tape Dental Injury: Teeth and Oropharynx as per pre-operative assessment

## 2015-01-05 NOTE — Interval H&P Note (Signed)
History and Physical Interval Note:  01/05/2015 7:06 AM  [name]  has presented today for surgery, with the diagnosis of scoliosis - stenosis - DDD  The various methods of treatment have been discussed with the patient and family. After consideration of risks, benefits and other options for treatment, the patient has consented to  Procedure(s): ANTERIOR LATERAL LUMBAR FUSION L2-3, L3-4, L4-5 (Left) with percutaneous pedicle screw fixation L 2 - L 5 levels as a surgical intervention .  The patient's history has been reviewed, patient examined, no change in status, stable for surgery.  I have reviewed the patient's chart and labs.  Questions were answered to the patient's satisfaction.     Julian Moran

## 2015-01-05 NOTE — Anesthesia Postprocedure Evaluation (Signed)
Anesthesia Post Note  Patient: Julian Moran  Procedure(s) Performed: Procedure(s) (LRB): Anterior lateral lumbar fusion lumbar two-lumbar three, lumbar three-lumbar four, lumbar four-lumbar five left with percutaneous pedicle screws lumbar two through five (Left) LUMBAR PERCUTANEOUS PEDICLE SCREW 3 LEVEL  Anesthesia type: General  Patient location: PACU  Post pain: Pain level controlled  Post assessment: Post-op Vital signs reviewed  Last Vitals: BP 150/77 mmHg  Pulse 94  Temp(Src) 36.3 C (Oral)  Resp 16  SpO2 99%  Post vital signs: Reviewed  Level of consciousness: sedated  Complications: No apparent anesthesia complications

## 2015-01-05 NOTE — Progress Notes (Signed)
Called by RN for patient choking.  Upon my arrival to patients room with Respiratory Therapy patient lying in bed, RN and family at bedside.  Patient and family state he is unable to get secretions out of the throat.  Sats 100% on nasal cannula 3 LPM, HR 90's, RR 22.  Patients mouth had been suctioned prior to my arrival, still coughing some trying to get secretions out.  Secretions are blood tinged.  Recommend calling MD to update.  Rn to call if assistance needed

## 2015-01-05 NOTE — OR Nursing (Signed)
Latricia Heft RN, Bryna Colander RN, and Dr. Vertell Limber attempted to place a foley catheter in the patient prior to calling Dr. Kathie Rhodes for assistance. Dr. Karsten Ro placed a 16 french foley catheter.

## 2015-01-05 NOTE — H&P (Signed)
Patient ID:   [id number] Patient: Julian Moran  Date of Birth: [birth year] Visit Type: Office Visit   Date: 11/07/2014 10:30 AM Provider: Marchia Meiers. Vertell Limber MD   This 69 year old male presents for back pain.  History of Present Illness: 1.  back pain  Julian Moran, 69 year old male, retired [employer], research (medical) and professional pool player visits reporting increased lumbar pain recently.  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  Recently he has been unable to get out of bed in the mornings due to lumbar pain.  Aleve is taken only as needed  Physical therapy and injections over the last few years offered little relief.  History: HTN, prostate cancer, thyroid Surgical history: Prostatectomy 2002, right shoulder replaced November 2015, both knees scoped 2013  Sinus surgery has been recommended but is on hold pending treatment of his back. MRI and x-rays on [place]  Lumbar radiographs demonstrate scoliosis and lateral listhesis and foraminal stenosis L2-3 on the right and L3 L4 on the right.  There is also anterolisthesis of L4 on L5 ranging from 9 mm on flexion to 9.5 mm on extension to 8 mm in neutral lateral radiograph.  There is degenerative disc disease and spondylosis at the L5-S1 level.  MRI demonstrates marked stenosis at L45.  There is foraminal stenosis at the L5-S1 level.  The patient is extremely limited in terms of walking and says he is only able to walk up to 25 yards at this point.  He has had a right shoulder replacement.  He says both legs go numb all the way to the toes.  He has been told that he has a sinus issue for which she needs surgery but he wants to hold off on that until he has his low back treated.        PAST MEDICAL/SURGICAL HISTORY   (Detailed)  Disease/disorder Onset Date Management Date Comments    Arthroscopy knee      shoulder surgery     Cancer, prostate      High cholesterol      Hypertension         PAST MEDICAL HISTORY, SURGICAL HISTORY, FAMILY HISTORY, SOCIAL HISTORY AND REVIEW OF SYSTEMS I have reviewed the patient's past medical, surgical, family and social history as well as the comprehensive review of systems as included on the [facility] history form dated 11/07/2014, which I have signed.  Family History  (Detailed) Patient reports there is no relevant family history.    SOCIAL HISTORY  (Detailed) Tobacco use reviewed. Preferred language is Unknown.   Smoking status: Never smoker.  SMOKING STATUS Use Status Type Smoking Status Usage Per Day Years Used Total Pack Years  no/never  Never smoker       HOME ENVIRONMENT/SAFETY The patient has not fallen in the last year.        MEDICATIONS(added, continued or stopped this visit): Started Medication Directions Instruction Stopped   allopurinol 300 mg tablet take 1 tablet by oral route  every day     aspirin 81 mg chewable tablet chew 1 tablet by oral route  every day     Fish Oil Concentrate 1,000 mg capsule Take as directed     indapamide 2.5 mg tablet take 1 tablet by oral route  every day in the morning     lisinopril 40 mg tablet take 1 tablet by oral route  every day  methimazole 10 mg tablet take 1 tablet by oral route  every day     omeprazole 20 mg capsule,delayed release take 1 capsule by oral route  every day 30 minutes to 1 hour before a meal     simvastatin 40 mg tablet take 1 tablet by oral route  every day in the evening    11/07/2014 Valium 10 mg tablet take 1 prior to MRI       ALLERGIES: Ingredient Reaction Medication Name Comment  NO KNOWN ALLERGIES     No known allergies.   REVIEW OF SYSTEMS System Neg/Pos Details  Constitutional Negative Chills, fatigue, fever, malaise, night sweats, weight gain and weight loss.  ENMT Negative Ear drainage, hearing loss, nasal drainage, otalgia, sinus  pressure and sore throat.  Eyes Negative Eye discharge, eye pain and vision changes.  Respiratory Negative Chronic cough, cough, dyspnea, known TB exposure and wheezing.  Cardio Negative Chest pain, claudication, edema and irregular heartbeat/palpitations.  GI Negative Abdominal pain, blood in stool, change in stool pattern, constipation, decreased appetite, diarrhea, heartburn, nausea and vomiting.  GU Negative Dribbling, dysuria, erectile dysfunction, hematuria, polyuria, slow stream, urinary frequency, urinary incontinence and urinary retention.  Endocrine Negative Cold intolerance, heat intolerance, polydipsia and polyphagia.  Neuro Negative Dizziness, extremity weakness, gait disturbance, headache, memory impairment, numbness in extremity, seizures and tremors.  Psych Negative Anxiety, depression and insomnia.  Integumentary Negative Brittle hair, brittle nails, change in shape/size of mole(s), hair loss, hirsutism, hives, pruritus, rash and skin lesion.  MS Positive Back pain.  Hema/Lymph Negative Easy bleeding, easy bruising and lymphadenopathy.  Allergic/Immuno Negative Contact allergy, environmental allergies, food allergies and seasonal allergies.  Reproductive Negative Penile discharge and sexual dysfunction.     Vitals Date Temp F BP Pulse Ht In Wt Lb BMI BSA Pain Score  11/07/2014  131/83 66 68 213 32.39  10/10     PHYSICAL EXAM General Level of Distress: no acute distress Overall Appearance: normal  Head and Face  Right Left  Fundoscopic Exam:  normal normal    Cardiovascular Cardiac: regular rate and rhythm without murmur  Right Left  Carotid Pulses: normal normal  Respiratory Lungs: clear to auscultation  Neurological Orientation: normal Recent and Remote Memory: normal Attention Span and Concentration:   normal Language: normal Fund of Knowledge: normal  Right Left Sensation: normal normal Upper Extremity Coordination: normal normal  Lower Extremity  Coordination: normal normal  Musculoskeletal Gait and Station: normal  Right Left Upper Extremity Muscle Strength: normal normal Lower Extremity Muscle Strength: normal normal Upper Extremity Muscle Tone:  normal normal Lower Extremity Muscle Tone: normal normal  Motor Strength Upper and lower extremity motor strength was tested in the clinically pertinent muscles.     Deep Tendon Reflexes  Right Left Biceps: normal normal Triceps: normal normal Brachiloradialis: normal normal Patellar: normal normal Achilles: absent absent  Sensory Sensation was tested at L1 to S1. Any abnormal findings will be noted below.  Right Left S1: decreased   Cranial Nerves II. Optic Nerve/Visual Fields: normal III. Oculomotor: normal IV. Trochlear: normal V. Trigeminal: normal VI. Abducens: normal VII. Facial: normal VIII. Acoustic/Vestibular: normal IX. Glossopharyngeal: normal X. Vagus: normal XI. Spinal Accessory: normal XII. Hypoglossal: normal  Motor and other Tests Lhermittes: negative Rhomberg: negative Pronator drift: absent     Right Left Hoffman's: normal normal Clonus: normal normal Babinski: normal normal SLR: negative negative Patrick's Corky Sox): negative negative Toe Walk: normal normal Toe Lift: normal normal Heel Walk: normal normal SI Joint: nontender nontender  Additional Findings:  Right sciatic notch discomfort to palpation.    IMPRESSION The patient has significant lumbar scoliosis and degenerative changes with spinal stenosis most markedly at the L45 level along with spondylolisthesis at this level.  He has not had recent lumbar MRI imaging.  I have recommended that this be performed along with AP and lateral scoliosis x-rays.  Completed Orders (this encounter) Order Details Reason Side Interpretation Result Initial Treatment Date Region  Lumbar Spine- AP/Lat/Obls/Spot/Flex/Ex      11/07/2014 All Levels to All Levels  Dietary management education,  guidance, and counseling Encouraged to eat a well balanced diet and follow up with primary care physician.         Assessment/Plan # Detail Type Description   1. Assessment DDD (degenerative disc disease), lumbar (M51.36).       2. Assessment Idiopathic scoliosis of lumbar region (M41.26).       3. Assessment Spinal stenosis of lumbar region (M48.06).       4. Assessment Spondylolisthesis, lumbar region (M43.16).       5. Assessment Lumbar radiculopathy (M54.16).       6. Assessment Body mass index (BMI) 32.0-32.9, adult (Y69.48).   Plan Orders Today's instructions / counseling include(s) Dietary management education, guidance, and counseling.         Pain Assessment/Treatment Pain Scale: 10/10. Method: Numeric Pain Intensity Scale. Location: back. Onset: 03/11/1966. Duration: varies. Quality: discomforting. Pain Assessment/Treatment follow-up plan of care: Patient is taking over the counter pain relievers for relief..  Fall Risk Plan The patient has not fallen in the last year.  Patient will return to see me after lumbar imaging has been performed.  Orders: Diagnostic Procedures: Assessment Procedure  M41.26 Scoliosis- AP/Lat  M43.16 MRI Spine/lumb W/o Contrast  M54.16 Lumbar Spine- AP/Lat/Obls/Spot/Flex/Ex  Instruction(s)/Education: Assessment Instruction  [phone] Dietary management education, guidance, and counseling    MEDICATIONS PRESCRIBED TODAY    Rx Quantity Refills  VALIUM 10 mg  1 0            Provider:  Marchia Meiers. Vertell Limber MD  11/12/2014 03:04 PM Dictation edited by: Marchia Meiers. Vertell Limber    CC Providers: [facility] [address] [place], [place] [address]-              Electronically signed by Marchia Meiers. Vertell Limber MD on 11/12/2014 03:04 PM  Patient ID:   [id number] Patient: Alverda Skeans  Date of Birth: March 06, 1946 Visit Type: Office Visit   Date: 12/19/2014 12:45 PM Provider: Marchia Meiers. Vertell Limber MD    This 69 year old male presents for back pain.  History of Present Illness: 1.  back pain  The patient returns today to review his lumbar imaging.  We also obtained AP and lateral scoliosis x-rays.  He has marked degeneration L1 through S1 levels with most severe stenosis at L4 L5 and scoliosis L2-3 and L3 L4 levels.  He has near complete disc space collapse at the L5-S1 level ex left at L2-3 L3 L4 and L4 L5 levels with percutaneous screw fixation but am concerned that he has persistent degenerative and scoliotic features at L1-2 and L5-S1 levels as well.  My plan is to perform L5-S1 anterior lumbar interbody fusion along with XL ORIF from a left-sided approach at L1-2, L2-3, L3 L4, L4-L5 levels with posterior instrumented fusion L1 through S1 levels.  I am going to review his imaging studies with Dr. Randel Pigg and decide whether we could do a more limited  surgery to get adequate relief of his symptomatic stenosis and scoliosis.      Medical/Surgical/Interim History Reviewed, no change.  Last detailed document date:11/07/2014.   PAST MEDICAL HISTORY, SURGICAL HISTORY, FAMILY HISTORY, SOCIAL HISTORY AND REVIEW OF SYSTEMS I have reviewed the patient's past medical, surgical, family and social history as well as the comprehensive review of systems as included on the [facility] history form dated 11/07/2014, which I have signed.  Family History: Reviewed, no changes.  Last detailed document: 11/07/2014.   Social History: Tobacco use reviewed. Reviewed, no changes. Last detailed document date: 11/07/2014.      MEDICATIONS(added, continued or stopped this visit): Started Medication Directions Instruction Stopped   allopurinol 300 mg tablet take 1 tablet by oral route  every day     aspirin 81 mg chewable tablet chew 1 tablet by oral route  every day     Fish Oil Concentrate 1,000 mg capsule Take as directed     indapamide 2.5 mg tablet take 1 tablet by oral route   every day in the morning     lisinopril 40 mg tablet take 1 tablet by oral route  every day     methimazole 10 mg tablet take 1 tablet by oral route  every day     omeprazole 20 mg capsule,delayed release take 1 capsule by oral route  every day 30 minutes to 1 hour before a meal     simvastatin 40 mg tablet take 1 tablet by oral route  every day in the evening    11/07/2014 Valium 10 mg tablet take 1 prior to MRI       ALLERGIES:    Vitals Date Temp F BP Pulse Ht In Wt Lb BMI BSA Pain Score  12/19/2014  129/72 86 68 220 33.45  9/10      IMPRESSION Lumbar scoliosis with spinal stenosis and spondylolisthesis  Completed Orders (this encounter) Order Details Reason Side Interpretation Result Initial Treatment Date Region  Scoliosis- AP/Lat      12/19/2014    Assessment/Plan # Detail Type Description   1. Assessment Other idiopathic scoliosis, lumbar region (M41.26).       2. Assessment Spondylolisthesis, lumbar region (M43.16).       3. Assessment Spinal stenosis of lumbar region (M48.06).       4. Assessment DDD (degenerative disc disease), lumbar (M51.36).       5. Assessment Lumbar radiculopathy (M54.16).         Pain Assessment/Treatment Pain Scale: 9/10. Method: Numeric Pain Intensity Scale. Onset: 03/11/1966.  Fall Risk Plan The patient has not fallen in the last year.  Proceed with decompression and fusion L1 to S1.  Risks and benefits were discussed in detail with patient and he wishes to proceed.the patient was fitted for an LSO brace today.  Orders: Diagnostic Procedures: Assessment Procedure  M41.26 Scoliosis- AP/Lat  M48.06 stage I ; ALIF L5-S1;  anterolateral interbody fusion (left side) L1-L2, L2-L3, L3-L4, L4-L5  M48.06 stage II; posterior intermittent fusion L1-S1  M54.16 Lumbar Spine- AP/Lat             Provider:  Marchia Meiers. Vertell Limber MD  12/24/2014 05:13 PM Dictation edited by: Marchia Meiers. Vertell Limber    CC Providers: [facility] [address] [place], [place] [address]-              Electronically signed by Marchia Meiers. Vertell Limber MD on 12/24/2014 05:13 PM  After further review of patient's studies, plan is XLIF L 23, L 34, L 45 with percutaneous pedicle screws L 2 - L 5 levels.

## 2015-01-05 NOTE — Progress Notes (Signed)
Awake, alert, conversant.  MAEW.  Good strength bilateral lower extremities.  Doing well.

## 2015-01-05 NOTE — Transfer of Care (Signed)
Immediate Anesthesia Transfer of Care Note  Patient: Julian Moran  Procedure(s) Performed: Procedure(s) with comments: Anterior lateral lumbar fusion lumbar two-lumbar three, lumbar three-lumbar four, lumbar four-lumbar five left with percutaneous pedicle screws lumbar two through five (Left) - Anterior lateral lumbar fusion lumbar two-lumbar three, lumbar three-lumbar four, lumbar four-lumbar five left with percutaneous pedicle screws lumbar two through five LUMBAR PERCUTANEOUS PEDICLE SCREW 3 LEVEL  Patient Location: PACU  Anesthesia Type:General  Level of Consciousness: sedated  Airway & Oxygen Therapy: Patient Spontanous Breathing and Patient connected to face mask oxygen  Post-op Assessment: Report given to RN, Post -op Vital signs reviewed and stable and Patient moving all extremities  Post vital signs: Reviewed and stable  Last Vitals:  Filed Vitals:   01/05/15 0607  BP: 118/81  Pulse: 66  Temp: 36.1 C  Resp: 18    Complications: No apparent anesthesia complications

## 2015-01-05 NOTE — Progress Notes (Signed)
Pt sleeping and snoring but when aroused, moans and rates pain at 10. Pt quickly returns to sleep after answering. Will give IV Ofirmev per Dr.Germeroth.

## 2015-01-05 NOTE — Progress Notes (Signed)
Utilization review completed.  

## 2015-01-05 NOTE — Consult Note (Signed)
Urology Consult  CC: Referring physician: Dr. Vertell Limber Reason for referral: Inability to place Foley catheter  History of Present Illness: Julian Moran is a 69 year old male who is seen as a intraoperative consultation for inability to place a Foley catheter.  He has a past history of having undergone a radical prostatectomy for prostate cancer.  He was to the operating room this morning for elective spinal surgery which was expected to last several hours.  Because of this an attempt at placement of Foley catheter was undertaken but unsuccessful and I was contacted for assistance. The patient was unable to give any history as he was intubated however Dr. Vertell Limber indicated his wife reported he had not had any voiding complaints that she knew of.  His difficulty at Foley catheter placement would be considered of moderate severity with no modifying factors or associated signs and symptoms.  Past Medical History  Diagnosis Date  . Hypertension   . Cancer [facility])     prostate  . Scoliosis   . Hyperlipidemia   . Hearing loss     wears hearing aids in both ears  . Sleep apnea     per wife  . Hyperthyroidism   . GERD (gastroesophageal reflux disease)   . Arthritis    Past Surgical History  Procedure Laterality Date  . Prostatectomy  2002  . Shoulder surgery Right november 2015  . Knee arthroscopy Bilateral   . Joint replacement  01-2014    total replacement    Medications:  Prior to Admission:  Prescriptions prior to admission  Medication Sig Dispense Refill Last Dose  . allopurinol (ZYLOPRIM) 300 MG tablet Take 300 mg by mouth daily.   01/04/2015 at Unknown time  . aspirin 81 MG tablet Take 81 mg by mouth daily.   Past Week at Unknown time  . colchicine 0.6 MG tablet Take 0.6 mg by mouth daily as needed. gout   01/04/2015 at Unknown time  . indapamide (LOZOL) 2.5 MG tablet Take 2.5 mg by mouth every morning.   01/04/2015 at Unknown time  . lisinopril (PRINIVIL,ZESTRIL) 40 MG tablet Take 20  mg by mouth daily.    01/04/2015 at Unknown time  . methimazole (TAPAZOLE) 10 MG tablet Take 10 mg by mouth daily.   01/04/2015 at Unknown time  . Omega-3 Fatty Acids (FISH OIL PO) Take by mouth daily.   Past Week at Unknown time  . omeprazole (PRILOSEC) 20 MG capsule Take 20 mg by mouth daily.   01/04/2015 at Unknown time  . simvastatin (ZOCOR) 40 MG tablet Take 20 mg by mouth daily.    01/04/2015 at Unknown time    Allergies: No Known Allergies  History reviewed. No pertinent family history.  Social History:  reports that he has never smoked. He has never used smokeless tobacco. He reports that he does not drink alcohol or use illicit drugs.  Review of Systems:  Pertinent items are noted in HPI. A comprehensive review of systems was not obtainable as the patient was intubated.  Physical Exam:  Vital signs in last 24 hours: Temp:  [96.9 F (36.1 C)-97.3 F (36.3 C)] 96.9 F (36.1 C) (10/27 0607) Pulse Rate:  [66-78] 66 (10/27 0607) Resp:  [18-20] 18 (10/27 0607) BP: (118-134)/(68-81) 118/81 mmHg (10/27 0607) SpO2:  [98 %-99 %] 98 % (10/27 0607) Weight:  [98.158 kg (216 lb 6.4 oz)] 98.158 kg (216 lb 6.4 oz) (10/26 1157) General appearance: alert and appears stated age Head: Normocephalic, without obvious abnormality, atraumatic Eyes:  taped shut for surgery Oropharynx: moist mucous membranes with endotracheal tube in place Neck: symmetrical, trachea midline Resp: normal chest excursion with respirations Cardio: regular rate and rhythm Back: unable to be examined. GI: soft abdomen  Male genitalia: penis: normal circumcised male phallus with no lesions or discharge.Testes: bilaterally descended with no masses or tenderness.  Extremities: extremities normal, atraumatic, no cyanosis or edema Skin: Skin color normal. No visible rashes or lesions Neurologic: unable to be performed  Laboratory Data:   Recent Labs  01/04/15 1300  WBC 5.9  HGB 15.3  HCT 43.8    BMET  Recent Labs  01/04/15 1300  NA 137  K 3.4*  CL 102  CO2 24  GLUCOSE 97  BUN 10  CREATININE 1.03  CALCIUM 9.5   No results for input(s): LABPT, INR in the last 72 hours. No results for input(s): LABURIN in the last 72 hours. Results for orders placed or performed during the hospital encounter of 01/04/15  Surgical pcr screen     Status: None   Collection Time: 01/04/15  1:00 PM  Result Value Ref Range Status   MRSA, PCR NEGATIVE NEGATIVE Final   Staphylococcus aureus NEGATIVE NEGATIVE Final    Comment:        The Xpert SA Assay (FDA approved for NASAL specimens in patients over 39 years of age), is one component of a comprehensive surveillance program.  Test performance has been validated by [facility] for patients greater than or equal to 73 year old. It is not intended to diagnose infection nor to guide or monitor treatment.    Creatinine:  Recent Labs  01/04/15 1300  CREATININE 1.03    Imaging: No results found.    PROCEDURE: using sterile technique with the patient in the supine position on the operating room table I sterilely prepped the genitalia. A 17 French flexible cystoscope was then passed under direct vision down the urethra which was noted to be normal.  The sphincter appeared to be intact.  Passing the scope through the sphincter I noted scarring in the area of the bladder neck with an absent prostate and I could actually visualize the bladder but I was unable to pass the scope through the area that had become somewhat scarred.  I therefore passed a 0.038 inch floppy-tipped guidewire through the cystoscope and into the bladder.  The cystoscope was then removed and I used the [facility] dilators to dilate the bladder neck up to 28 [place].  I then reinserted the cystoscope and was able to pass this easily into the bladder which was found to have 1-2+ trabeculation but no tumors, stones or inflammatory lesions were noted. I therefore removed the  cystoscope and modified an 55 French Coude catheter by cutting the tip off and passing this over the guidewire into the bladder.  The balloon was filled and clear urine drained.  The catheter was connected to closed system drainage.  Impression/Assessment:   1.  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  2.  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 The catheter can be removed when no longer needed for bladder drainage.  The patient should not have further difficulty now that his bladder neck has been dilated and in fact he was not having significant voiding symptoms preoperatively.  The degree of his bladder neck contracture was not causing him any difficulty with voiding and was primarily a problem with catheter placement.  Plan:  Foley catheter until no longer needed to monitor urine output and then it can be removed at any time.   Renuka Farfan C 01/05/2015, 9:16 AM

## 2015-01-05 NOTE — Progress Notes (Signed)
.  Pt arrived to 5C10 @1604 , Pt A&Ox 4, c/o pain 8/10. Dressing CDI, no drains. Pt VS taken, pt on O2 from PACU, Fluids runningat 75 cc/hr. Foley intact, unclamped. Pt without distress. Family at the bedside. Diet ordered, will monitor.

## 2015-01-06 ENCOUNTER — Encounter (HOSPITAL_COMMUNITY): Payer: Self-pay | Admitting: Neurosurgery

## 2015-01-06 ENCOUNTER — Inpatient Hospital Stay (HOSPITAL_COMMUNITY): Payer: Medicare Other

## 2015-01-06 DIAGNOSIS — R0902 Hypoxemia: Secondary | ICD-10-CM | POA: Insufficient documentation

## 2015-01-06 LAB — BASIC METABOLIC PANEL
Anion gap: 10 (ref 5–15)
BUN: 9 mg/dL (ref 6–20)
CALCIUM: 8.9 mg/dL (ref 8.9–10.3)
CO2: 28 mmol/L (ref 22–32)
Chloride: 95 mmol/L — ABNORMAL LOW (ref 101–111)
Creatinine, Ser: 1.11 mg/dL (ref 0.61–1.24)
GFR calc Af Amer: >60 mL/min (ref >60)
GLUCOSE: 85 mg/dL (ref 65–99)
Potassium: 3.6 mmol/L (ref 3.5–5.1)
Sodium: 133 mmol/L — ABNORMAL LOW (ref 135–145)

## 2015-01-06 MED ORDER — GUAIFENESIN ER 600 MG PO TB12
1200.0000 mg | ORAL_TABLET | Freq: Two times a day (BID) | ORAL | Status: DC
  Administered 2015-01-06 – 2015-01-09 (×7): 1200 mg via ORAL
  Filled 2015-01-06 (×7): qty 2

## 2015-01-06 MED ORDER — PROMETHAZINE HCL 25 MG RE SUPP: 12.5000 mg | Freq: Four times a day (QID) | RECTAL | Status: DC | PRN

## 2015-01-06 MED ORDER — PROMETHAZINE HCL 25 MG/ML IJ SOLN
12.5000 mg | Freq: Four times a day (QID) | INTRAMUSCULAR | Status: DC | PRN
Start: 2015-01-06 — End: 2015-01-09
  Administered 2015-01-06: 12.5 mg via INTRAVENOUS
  Filled 2015-01-06: qty 1

## 2015-01-06 MED ORDER — DIAZEPAM 5 MG PO TABS
5.0000 mg | ORAL_TABLET | Freq: Four times a day (QID) | ORAL | Status: DC | PRN
  Administered 2015-01-06 – 2015-01-09 (×8): 5 mg via ORAL
  Filled 2015-01-06 (×8): qty 1

## 2015-01-06 MED ORDER — PANTOPRAZOLE SODIUM 40 MG PO TBEC
40.0000 mg | DELAYED_RELEASE_TABLET | Freq: Every day | ORAL | Status: DC
  Administered 2015-01-07 – 2015-01-09 (×3): 40 mg via ORAL
  Filled 2015-01-06 (×3): qty 1

## 2015-01-06 MED ORDER — PROMETHAZINE HCL 25 MG PO TABS
12.5000 mg | ORAL_TABLET | Freq: Four times a day (QID) | ORAL | Status: DC | PRN
Start: 2015-01-06 — End: 2015-01-09

## 2015-01-06 NOTE — Progress Notes (Signed)
OT Cancellation Note  Patient Details Name: Julian Moran MRN: [id number] DOB: [birth year]   Cancelled Treatment:    Reason Eval/Treat Not Completed: Medical issues which prohibited therapy - as per PT notes, will defer eval at this time, and will reattempt this pm as schedule permits   Darlina Rumpf [place], OTR/L [phone]  01/06/2015, 11:03 AM

## 2015-01-06 NOTE — Progress Notes (Signed)
Subjective: Patient reports "I have some pain still"  Objective: Vital signs in last 24 hours: Temp:  [97.4 F (36.3 C)-98.3 F (36.8 C)] 97.6 F (36.4 C) (10/28 0905) Pulse Rate:  [77-107] 77 (10/28 0905) Resp:  [14-22] 20 (10/28 0905) BP: (113-171)/(61-93) 113/61 mmHg (10/28 0905) SpO2:  [94 %-100 %] 100 % (10/28 0905)  Intake/Output from previous day: 10/27 0701 - 10/28 0700 In: 2900 [I.V.:2900] Out: 1390 [Urine:1390] Intake/Output this shift:    Alert, conversant. Wife at bedside. Discussed last night's episode at length with DrStern. No issues since. Pain localized to lumbar region at present. N/V after diludid.  Incisions without erythema, swelling, or drainage. Good strength BLE. No leg pain.  Lab Results:  Recent Labs  01/04/15 1300  WBC 5.9  HGB 15.3  HCT 43.8  PLT 172   BMET  Recent Labs  01/04/15 1300  NA 137  K 3.4*  CL 102  CO2 24  GLUCOSE 97  BUN 10  CREATININE 1.03  CALCIUM 9.5    Studies/Results: Dg Lumbar Spine 2-3 Views  01/05/2015  CLINICAL DATA:  L2-5 posterior lumbar fusion EXAM: DG C-ARM GT 120 MIN; LUMBAR SPINE - 2-3 VIEW COMPARISON:  12/19/2014 spine radiographs. FINDINGS: Fluoroscopy time 7 minutes 21 seconds. For spot fluoroscopic intraoperative nondiagnostic radiographs were provided, which demonstrate postsurgical changes from bilateral posterior lumbar fusion from L2-L5 with pedicle screws bilaterally at each level and bone cages in the L2-3, L3-4 and L4-5 disc spaces. Atherosclerotic calcifications are present in the abdominal aorta. IMPRESSION: Intraoperative fluoroscopic guidance for L2-5 bilateral posterior lumbar spine fusion. Electronically Signed   By: Ilona Sorrel M.D.   On: 01/05/2015 12:55   Dg C-arm Gt 120 Min  01/05/2015  CLINICAL DATA:  L2-5 posterior lumbar fusion EXAM: DG C-ARM GT 120 MIN; LUMBAR SPINE - 2-3 VIEW COMPARISON:  12/19/2014 spine radiographs. FINDINGS: Fluoroscopy time 7 minutes 21 seconds. For spot  fluoroscopic intraoperative nondiagnostic radiographs were provided, which demonstrate postsurgical changes from bilateral posterior lumbar fusion from L2-L5 with pedicle screws bilaterally at each level and bone cages in the L2-3, L3-4 and L4-5 disc spaces. Atherosclerotic calcifications are present in the abdominal aorta. IMPRESSION: Intraoperative fluoroscopic guidance for L2-5 bilateral posterior lumbar spine fusion. Electronically Signed   By: Ilona Sorrel M.D.   On: 01/05/2015 12:55    Assessment/Plan: Improving, some nausea persists.   LOS: 1 day  Per DrStern, will c/s Pulmonary for eval (s/w Dr. Titus Mould); d/c Foley, Valium 5mg  po qid prn spasm, continue dilaudid for now; Phenergan po/IM/IV prn. Mobilize in LSO with PT.    Verdis Prime 01/06/2015, 9:52 AM

## 2015-01-06 NOTE — Care Management Note (Signed)
Case Management Note  Patient Details  Name: KELSON QUEENAN MRN: [id number] Date of Birth: 07-11-1945  Subjective/Objective:                    Action/Plan: Patient admitted with lumbar spine scoliosis. Pt underwent a L2-5 ALIF with screws. Patient lives at home with his wife. Awaiting PT/OT recommendations for discharge disposition. CM will continue to follow for discharge needs.   Expected Discharge Date:                  Expected Discharge Plan:  Home/Self Care  In-House Referral:     Discharge planning Services     Post Acute Care Choice:    Choice offered to:     DME Arranged:    DME Agency:     HH Arranged:    HH Agency:     Status of Service:  In process, will continue to follow  Medicare Important Message Given:    Date Medicare IM Given:    Medicare IM give by:    Date Additional Medicare IM Given:    Additional Medicare Important Message give by:     If discussed at [place] of Stay Meetings, dates discussed:    Additional Comments:  Pollie Friar, RN 01/06/2015, 10:54 AM

## 2015-01-06 NOTE — Clinical Social Work Note (Signed)
CSW received referral for SNF, PT and OT are not recommending any follow up, CSW to sign off please re-consult if social work needs arise.  Jones Broom. [place], MSW, [place]

## 2015-01-06 NOTE — Progress Notes (Signed)
PT Cancellation Note  Patient Details Name: Julian Moran MRN: [id number] DOB: 1945/04/04   Cancelled Treatment:    Reason Eval/Treat Not Completed: Medical issues which prohibited therapy   Spoke with RN and pt currently with nausea and needs new IV for ability to give meds. Also noted NO ORDER for brace, yet Dr. Melven Sartorius note states up with LSO. RN to followup with Dr. Vertell Limber for clarification.   Kariem Wolfson 01/06/2015, 10:06 AM  Pager [phone]

## 2015-01-06 NOTE — Evaluation (Signed)
Occupational Therapy Evaluation Patient Details Name: Julian Moran MRN: [id number] DOB: Oct 22, 1945 Today's Date: 01/06/2015    History of Present Illness This 69 y.o. male admitted for Anterior lateral lumbar fusion L2-3, L3-4, L4-5 due to scoliosis, stenosis, DDD, spondylolisthesis, radiculopathy.  PMH includes:  OSA, HTN, prostate CA, hearing loss, GERD    Clinical Impression   Pt admitted with above. He demonstrates the below listed deficits and will benefit from continued OT to maximize safety and independence with BADLs.  Pt doing well first day post op.  He will likely require use of AE for LB ADLs.  Reviewed back precautions and safety with ADLs.  Will continue to follow.       Follow Up Recommendations  No OT follow up;Supervision/Assistance - 24 hour    Equipment Recommendations  3 in 1 bedside comode    Recommendations for Other Services       Precautions / Restrictions Precautions Precautions: Back;Fall Precaution Booklet Issued: Yes (comment) Precaution Comments: Pt able to recall 3/3 back precautions independently after PT eval.   He requires min cues to adhere to them       Mobility Bed Mobility                  Transfers Overall transfer level: Needs assistance   Transfers: Sit to/from Stand;Stand Pivot Transfers Sit to Stand: Min assist Stand pivot transfers: Min assist       General transfer comment: Min A to steady.  min cues for safety and precautions     Balance Overall balance assessment: Needs assistance Sitting-balance support: Feet supported Sitting balance-Leahy Scale: Fair     Standing balance support: Bilateral upper extremity supported Standing balance-Leahy Scale: Poor Standing balance comment: reliant on bil. UE support                             ADL Overall ADL's : Needs assistance/impaired Eating/Feeding: Independent   Grooming: Wash/dry hands;Wash/dry face;Oral care;Minimal assistance;Standing    Upper Body Bathing: Minimal assitance;Sitting   Lower Body Bathing: Maximal assistance;Sit to/from stand   Upper Body Dressing : Moderate assistance;Sitting   Lower Body Dressing: Total assistance;Sit to/from stand Lower Body Dressing Details (indicate cue type and reason): Pt able to cross Rt ankle over Lt knee, but unable to cross Lt over Rt.  He is unable to don/doff socks by crossing legs.  He reports he was able to do this PTA  Toilet Transfer: Minimal assistance;Ambulation;Comfort height toilet;BSC;RW   Toileting- Clothing Manipulation and Hygiene: Sit to/from stand;Moderate assistance       Functional mobility during ADLs: Minimal assistance;Rolling walker       Vision     Perception     Praxis      Pertinent Vitals/Pain Pain Assessment: Faces Faces Pain Scale: Hurts little more Pain Location: back  Pain Descriptors / Indicators: Grimacing;Aching Pain Intervention(s): Monitored during session;RN gave pain meds during session     Hand Dominance Right   Extremity/Trunk Assessment Upper Extremity Assessment Upper Extremity Assessment: Overall WFL for tasks assessed (h/o Rt shoulder replacement )   Lower Extremity Assessment Lower Extremity Assessment: Defer to PT evaluation       Communication Communication Communication: HOH   Cognition Arousal/Alertness: Awake/alert Behavior During Therapy: WFL for tasks assessed/performed Overall Cognitive Status: Within Functional Limits for tasks assessed                     General Comments  Exercises       Shoulder Instructions      Home Living Family/patient expects to be discharged to:: Private residence Living Arrangements: Spouse/significant other Available Help at Discharge: Family;Available 24 hours/day Type of Home: House Home Access: Stairs to enter [employer] of Steps: 6 Entrance Stairs-Rails: Right;Left;Can reach both Home Layout: Multi-level;Able to live on main level  with bedroom/bathroom (3)     Bathroom Shower/Tub: Occupational psychologist: Standard Bathroom Accessibility: Yes   Home Equipment: Cane - single point;Walker - 2 wheels;Hand held shower head          Prior Functioning/Environment Level of Independence: Independent        Comments: could walk ~75 ft and Rt hip/leg pain would be too much; would have to sit and rest    OT Diagnosis: Generalized weakness;Acute pain   OT Problem List: Decreased strength;Decreased knowledge of use of DME or AE;Decreased knowledge of precautions;Obesity;Pain   OT Treatment/Interventions: Self-care/ADL training;DME and/or AE instruction;Therapeutic activities;Patient/family education;Balance training    OT Goals(Current goals can be found in the care plan section) Acute Rehab OT Goals Patient Stated Goal: did not state  OT Goal Formulation: With patient/family Time For Goal Achievement: 01/13/15 Potential to Achieve Goals: Good ADL Goals Pt Will Perform Grooming: with supervision;standing Pt Will Perform Upper Body Bathing: with set-up;with supervision;sitting Pt Will Perform Lower Body Bathing: with min guard assist;with adaptive equipment;sit to/from stand Pt Will Perform Upper Body Dressing: with supervision;sitting Pt Will Perform Lower Body Dressing: with min guard assist;with adaptive equipment;sit to/from stand Pt Will Transfer to Toilet: with supervision;ambulating;regular height toilet;bedside commode;grab bars Pt Will Perform Toileting - Clothing Manipulation and hygiene: with supervision;with adaptive equipment;sit to/from stand Pt Will Perform Tub/Shower Transfer: Shower transfer;with min guard assist;ambulating;shower seat;3 in 1;rolling walker  OT Frequency: Min 2X/week   Barriers to D/C:            Co-evaluation              End of Session Equipment Utilized During Treatment: Back brace;Rolling walker Nurse Communication: Mobility status  Activity Tolerance:  Patient tolerated treatment well Patient left: in chair;with call bell/phone within reach;with nursing/sitter in room   Time: 1228-1259 OT Time Calculation (min): 31 min Charges:  OT General Charges $OT Visit: 1 Procedure OT Evaluation $Initial OT Evaluation Tier I: 1 Procedure OT Treatments $Self Care/Home Management : 8-22 mins G-Codes:    Brenlyn Beshara M 02-05-15, 1:28 PM

## 2015-01-06 NOTE — Consult Note (Signed)
Name: Julian Moran MRN: [id number] DOB: [birth year]    ADMISSION DATE:  01/05/2015 CONSULTATION DATE:  01/05/15  REFERRING MD :  Vickie Epley, MD  CHIEF COMPLAINT:  Acute hypoxia   BRIEF PATIENT DESCRIPTION: 69 post op L spine surgery, untreated OSA, with acute hypoxia from mucous plug  SIGNIFICANT EVENTS  10/27 post op L spine, hypoxia resolved after suctioning  STUDIES:  pcxr 10/28>>> Neck xray 10/28>>>  HISTORY OF PRESENT ILLNESS:  69 yr old obese male back pain, osa not compliant with therapy presented elective L spine surgery multilevel with screws placed. About 2 hrs post op with acute episode hypoxia, RN wife stated he "was blue", extensive suctioning took place and resolved and reoccurred once further. He was post op and lethargic with pain control meds and residual anesthetics. No hemoptysis, was a long OR case 5 hrs with downward position. He has been RX cpap but refuses. He remains on 3 liters and has not ambulated yet. No CP, N/ V.  PAST MEDICAL HISTORY :   has a past medical history of Hypertension; Cancer ([place]); Scoliosis; Hyperlipidemia; Hearing loss; Sleep apnea; Hyperthyroidism; GERD (gastroesophageal reflux disease); and Arthritis.  has past surgical history that includes Prostatectomy (2002); Shoulder surgery (Right, november 2015); Knee arthroscopy (Bilateral); and Joint replacement (01-2014). Prior to Admission medications   Medication Sig Start Date End Date Taking? Authorizing Provider  allopurinol (ZYLOPRIM) 300 MG tablet Take 300 mg by mouth daily.   Yes Historical Provider, MD  aspirin 81 MG tablet Take 81 mg by mouth daily.   Yes Historical Provider, MD  colchicine 0.6 MG tablet Take 0.6 mg by mouth daily as needed. gout   Yes Historical Provider, MD  indapamide (LOZOL) 2.5 MG tablet Take 2.5 mg by mouth every morning.   Yes Historical Provider, MD  lisinopril (PRINIVIL,ZESTRIL) 40 MG tablet Take 20 mg by mouth daily.    Yes Historical Provider, MD    methimazole (TAPAZOLE) 10 MG tablet Take 10 mg by mouth daily.   Yes Historical Provider, MD  Omega-3 Fatty Acids (FISH OIL PO) Take by mouth daily.   Yes Historical Provider, MD  omeprazole (PRILOSEC) 20 MG capsule Take 20 mg by mouth daily.   Yes Historical Provider, MD  simvastatin (ZOCOR) 40 MG tablet Take 20 mg by mouth daily.    Yes Historical Provider, MD   No Known Allergies  FAMILY HISTORY:  family history is not on file.noncontibutory SOCIAL HISTORY:  reports that he has never smoked. He has never used smokeless tobacco. He reports that he does not drink alcohol or use illicit drugs.  REVIEW OF SYSTEMS:   Constitutional: Negative for fever, chills, weight loss, malaise/fatigue and diaphoresis.  HENT: had hearing loss during mucous plug that resolved after coughing it up, ear pain also resolved with this episode, no nosebleeds, pos congestion, sore throat, neck pain, tinnitus and ear discharge.   Eyes: Negative for blurred vision, double vision, photophobia, pain, discharge and redness.  Respiratory: pos for cough, neg hemoptysis, pos sputum production, neg shortness of breath, neg wheezing and stridor.   Cardiovascular: Negative for chest pain, palpitations, orthopnea, claudication, leg swelling and PND.  Gastrointestinal: Negative for heartburn, nausea, vomiting, abdominal pain, diarrhea, constipation, blood in stool and melena.  Genitourinary: Negative for dysuria, urgency, frequency, hematuria and flank pain.  Musculoskeletal: Negative for myalgias, pos for post op back pain, joint pain and falls.  Skin: Negative for itching and rash.  Neurological: Negative for dizziness, tingling, tremors, sensory change, speech  change, focal weakness, seizures, loss of consciousness, weakness and headaches.  Endo/Heme/Allergies: Negative for environmental allergies and polydipsia. Does not bruise/bleed easily.  SUBJECTIVE: no distress, no SOB now  VITAL SIGNS: Temp:  [97.4 F (36.3  C)-98.3 F (36.8 C)] 97.6 F (36.4 C) (10/28 0905) Pulse Rate:  [77-107] 77 (10/28 0905) Resp:  [14-22] 20 (10/28 0905) BP: (113-171)/(61-93) 113/61 mmHg (10/28 0905) SpO2:  [94 %-100 %] 100 % (10/28 0905)  PHYSICAL EXAMINATION: General:  Awake now, no distress Neuro:  Perr, strength equal All ext HEENT:  No stridor now, no upper airway sounds abnormal Cardiovascular:  s1 s2 RRR distant Lungs:  CTA anterior Abdomen:  Soft, obese, NT, ND Musculoskeletal:  No edema Skin:  No rash   Recent Labs Lab 01/04/15 1300  NA 137  K 3.4*  CL 102  CO2 24  BUN 10  CREATININE 1.03  GLUCOSE 97    Recent Labs Lab 01/04/15 1300  HGB 15.3  HCT 43.8  WBC 5.9  PLT 172   Dg Lumbar Spine 2-3 Views  01/05/2015  CLINICAL DATA:  L2-5 posterior lumbar fusion EXAM: DG C-ARM GT 120 MIN; LUMBAR SPINE - 2-3 VIEW COMPARISON:  12/19/2014 spine radiographs. FINDINGS: Fluoroscopy time 7 minutes 21 seconds. For spot fluoroscopic intraoperative nondiagnostic radiographs were provided, which demonstrate postsurgical changes from bilateral posterior lumbar fusion from L2-L5 with pedicle screws bilaterally at each level and bone cages in the L2-3, L3-4 and L4-5 disc spaces. Atherosclerotic calcifications are present in the abdominal aorta. IMPRESSION: Intraoperative fluoroscopic guidance for L2-5 bilateral posterior lumbar spine fusion. Electronically Signed   By: Ilona Sorrel M.D.   On: 01/05/2015 12:55   Dg C-arm Gt 120 Min  01/05/2015  CLINICAL DATA:  L2-5 posterior lumbar fusion EXAM: DG C-ARM GT 120 MIN; LUMBAR SPINE - 2-3 VIEW COMPARISON:  12/19/2014 spine radiographs. FINDINGS: Fluoroscopy time 7 minutes 21 seconds. For spot fluoroscopic intraoperative nondiagnostic radiographs were provided, which demonstrate postsurgical changes from bilateral posterior lumbar fusion from L2-L5 with pedicle screws bilaterally at each level and bone cages in the L2-3, L3-4 and L4-5 disc spaces. Atherosclerotic  calcifications are present in the abdominal aorta. IMPRESSION: Intraoperative fluoroscopic guidance for L2-5 bilateral posterior lumbar spine fusion. Electronically Signed   By: Ilona Sorrel M.D.   On: 01/05/2015 12:55    ASSESSMENT / PLAN:  Acute Mucous plug upper airway, hypopharynx secondary to untreated OSA, narcotics (post op) resulting in hypoxia that resolved with deep suction OSA with noncompliance  S/p Extensive lumbar surgery - IS -flutter valve important -add high dose mucinex -hold ACEI for now with upper airway concerns, likley can restart upon dc -get pcxr assess atx -get neck soft tissue xray -re assess bmet for K -re add pulse x continuous x 24 hr -will need ambulatory pre dc pulse ox -limit narcs as able -He will be seen by DR young in am Sleep med, can follow up as outpt for other options for treatment, O2 at night? -if more mucopus plug, add mucomyst's nebs -PT per NS -preventative NTS order  Fully updated wife and pt in room  [employer]. Titus Mould, MD, [place] Pgr: [facility]  Pulmonary and [place] Pager: [phone]  01/06/2015, 11:43 AM

## 2015-01-06 NOTE — Progress Notes (Signed)
Pt had 2 episodes last night where he was fidgeting, restless, and felt hot.  Pt vital signs were stable during those times and oxygen level has been in high 90's on 3 Liters of oxygen nasal cannula. Pt wife questions he if is having an allergic reaction to the medications, he had no apparent rashes or hives.  Pt had pain medications multiple times throughout the night, and he did not have this reaction after every dose.  Pt had 2 episodes of vomiting last night also, zofran was given. Report given to day shift nurse.  Fredrich Romans, RN

## 2015-01-06 NOTE — Evaluation (Signed)
Physical Therapy Evaluation Patient Details Name: Julian Moran MRN: [id number] DOB: [birth year] Today's Date: 01/06/2015   History of Present Illness  This 69 y.o. male admitted for Anterior lateral lumbar fusion L2-3, L3-4, L4-5 due to scoliosis, stenosis, DDD, spondylolisthesis, radiculopathy.  PMH includes:  OSA, HTN, prostate CA, hearing loss, GERD     Clinical Impression  Patient is s/p above surgery resulting in the deficits listed below (see PT Problem List). Patient slightly drowsy prior to sit EOB. Patient will benefit from skilled PT to increase their independence and safety with mobility (while adhering to their precautions) to allow discharge to the venue listed below.     Follow Up Recommendations No PT follow up    Equipment Recommendations  None recommended by PT    Recommendations for Other Services       Precautions / Restrictions Precautions Precautions: Back;Fall Precaution Booklet Issued: Yes (comment) Precaution Comments: Pt able to state 3/3 after education Required Braces or Orthoses: Spinal Brace (per MD note (and confirmed with Dr Vertell Limber)) Spinal Brace: Lumbar corset;Applied in sitting position      Mobility  Bed Mobility Overal bed mobility: Needs Assistance Bed Mobility: Rolling;Sidelying to Sit Rolling: Supervision Sidelying to sit: Min assist       General bed mobility comments: vc for proper rolling; incr effort and assist to raise torso side to sit  Transfers Overall transfer level: Needs assistance   Transfers: Sit to/from Stand Sit to Stand: Min assist Stand pivot transfers: Min assist       General transfer comment: Min A to steady.  min cues for safety and precautions   Ambulation/Gait Ambulation/Gait assistance: Min assist Ambulation Distance (Feet): 15 Feet Assistive device: Rolling walker (2 wheeled) Gait Pattern/deviations: Step-through pattern;Decreased stride length   Gait velocity interpretation: Below normal speed  for age/gender General Gait Details: assist to steer RW; vc for upright posture (tends to flex at hips and look down)  Stairs            Wheelchair Mobility    Modified Rankin (Stroke Patients Only)       Balance Overall balance assessment: Needs assistance Sitting-balance support: No upper extremity supported;Feet supported Sitting balance-Leahy Scale: Fair     Standing balance support: Bilateral upper extremity supported Standing balance-Leahy Scale: Poor Standing balance comment: reliant on bil. UE support                              Pertinent Vitals/Pain On continuous pulse ox (99% on 3L; 97% on RA once seated) After ambulation 93% on room air; after 1 minute 95% on room air RN ok with leaving on room air as long as continuous pulse ox on   Pain Assessment: 0-10 Pain Score: 8  Faces Pain Scale: Hurts little more Pain Location: back Pain Descriptors / Indicators: Operative site guarding;Aching Pain Intervention(s): Limited activity within patient's tolerance;Monitored during session;Premedicated before session;Repositioned;Patient requesting pain meds-RN notified    Home Living Family/patient expects to be discharged to:: Private residence Living Arrangements: Spouse/significant other Available Help at Discharge: Family;Available 24 hours/day Type of Home: House Home Access: Stairs to enter Entrance Stairs-Rails: Right;Left;Can reach both Entrance Stairs-Number of Steps: 6 Home Layout: Multi-level;Able to live on main level with bedroom/bathroom (3) Home Equipment: Cane - single point;Walker - 2 wheels;Hand held shower head      Prior Function Level of Independence: Independent         Comments: could walk ~75  ft and Rt hip/leg pain would be too much; would have to sit and rest     Hand Dominance   Dominant Hand: Right    Extremity/Trunk Assessment   Upper Extremity Assessment: Defer to OT evaluation;Overall WFL for tasks assessed            Lower Extremity Assessment: RLE deficits/detail RLE Deficits / Details: Strength/ROM WFL    Cervical / Trunk Assessment: Other exceptions  Communication   Communication: HOH  Cognition Arousal/Alertness: Awake/alert Behavior During Therapy: WFL for tasks assessed/performed Overall Cognitive Status: Within Functional Limits for tasks assessed                      General Comments General comments (skin integrity, edema, etc.): wife present during eval and is very supportive     Exercises        Assessment/Plan    PT Assessment Patient needs continued PT services  PT Diagnosis Acute pain;Difficulty walking   PT Problem List Decreased activity tolerance;Decreased balance;Decreased mobility;Decreased knowledge of use of DME;Decreased knowledge of precautions;Impaired sensation;Obesity;Pain  PT Treatment Interventions DME instruction;Gait training;Stair training;Functional mobility training;Therapeutic activities;Patient/family education   PT Goals (Current goals can be found in the Care Plan section) Acute Rehab PT Goals Patient Stated Goal: be able to walk >25 yards PT Goal Formulation: With patient Time For Goal Achievement: 01/09/15 Potential to Achieve Goals: Good    Frequency Min 5X/week   Barriers to discharge        Co-evaluation               End of Session Equipment Utilized During Treatment: Gait belt;Back brace Activity Tolerance: Patient tolerated treatment well Patient left: in chair;with call bell/phone within reach;with chair alarm set;with family/visitor present;Other (comment) (OT in room) Nurse Communication: Mobility status         Time: 5859-2924 PT Time Calculation (min) (ACUTE ONLY): 34 min   Charges:   PT Evaluation $Initial PT Evaluation Tier I: 1 Procedure PT Treatments $Therapeutic Activity: 8-22 mins (charges adjusted, multiple interruptions)   PT G Codes:        Lusine Corlett Jan 24, 2015, 1:59 PM Pager  [phone]

## 2015-01-07 DIAGNOSIS — J9809 Other diseases of bronchus, not elsewhere classified: Secondary | ICD-10-CM

## 2015-01-07 DIAGNOSIS — G4733 Obstructive sleep apnea (adult) (pediatric): Secondary | ICD-10-CM | POA: Diagnosis present

## 2015-01-07 DIAGNOSIS — T17500A Unspecified foreign body in bronchus causing asphyxiation, initial encounter: Secondary | ICD-10-CM

## 2015-01-07 MED ORDER — CIPROFLOXACIN HCL 500 MG PO TABS
500.0000 mg | ORAL_TABLET | Freq: Two times a day (BID) | ORAL | Status: DC
  Administered 2015-01-07 – 2015-01-09 (×5): 500 mg via ORAL
  Filled 2015-01-07 (×5): qty 1

## 2015-01-07 NOTE — Progress Notes (Signed)
PT Cancellation Note  Patient Details Name: Julian Moran MRN: [id number] DOB: 1946/03/05   Cancelled Treatment:    Reason Eval/Treat Not Completed: Patient declined. He has walked in hallway with nursing today and had pain medicine a little ago. Requested PT come back later. Will follow up to day as time allows vs tomorrow.   Willow Ora, PTA, CLT Acute Rehab Services [phone] 01/07/15, 2:18 PM  Willow Ora 01/07/2015, 2:17 PM

## 2015-01-07 NOTE — Progress Notes (Signed)
Subjective: Patient reports improving  Objective: Vital signs in last 24 hours: Temp:  [97.9 F (36.6 C)-98.6 F (37 C)] 98.1 F (36.7 C) (10/29 0915) Pulse Rate:  [63-90] 63 (10/29 0915) Resp:  [18-20] 18 (10/29 0915) BP: (106-129)/(54-71) 129/66 mmHg (10/29 0915) SpO2:  [95 %-98 %] 95 % (10/29 0915) Weight:  [106 kg (233 lb 11 oz)] 106 kg (233 lb 11 oz) (10/28 1236)  Intake/Output from previous day: 10/28 0701 - 10/29 0700 In: 860 [P.O.:860] Out: 1700 [Urine:1700] Intake/Output this shift: Total I/O In: -  Out: 450 [Urine:450]  Physical Exam: Strength full. Dressings CDI.  Lab Results:  Recent Labs  01/04/15 1300  WBC 5.9  HGB 15.3  HCT 43.8  PLT 172   BMET  Recent Labs  01/04/15 1300 01/06/15 1404  NA 137 133*  K 3.4* 3.6  CL 102 95*  CO2 24 28  GLUCOSE 97 85  BUN 10 9  CREATININE 1.03 1.11  CALCIUM 9.5 8.9    Studies/Results: Dg Neck Soft Tissue  01/06/2015  CLINICAL DATA:  Hypoxia. EXAM: NECK SOFT TISSUES - 1+ VIEW COMPARISON:  None. FINDINGS: There is no evidence of retropharyngeal soft tissue swelling or epiglottic enlargement. The cervical airway is unremarkable and no radio-opaque foreign body identified. There is moderate cervical spondylosis and slight anterior spurring, but not hypertrophic. Mild degenerative anterolisthesis C2 on C3. IMPRESSION: Negative for airway obstruction. Electronically Signed   By: Staci Righter M.D.   On: 01/06/2015 17:25   Dg Chest 1 View  01/06/2015  CLINICAL DATA:  Hypoxia EXAM: CHEST 1 VIEW COMPARISON:  None. FINDINGS: Lungs are clear.  No pleural effusion or pneumothorax. The heart is top-normal in size. Right shoulder arthroplasty. IMPRESSION: No evidence of acute cardiopulmonary disease. Electronically Signed   By: Julian Hy M.D.   On: 01/06/2015 17:28   Dg Lumbar Spine 2-3 Views  01/05/2015  CLINICAL DATA:  L2-5 posterior lumbar fusion EXAM: DG C-ARM GT 120 MIN; LUMBAR SPINE - 2-3 VIEW COMPARISON:   12/19/2014 spine radiographs. FINDINGS: Fluoroscopy time 7 minutes 21 seconds. For spot fluoroscopic intraoperative nondiagnostic radiographs were provided, which demonstrate postsurgical changes from bilateral posterior lumbar fusion from L2-L5 with pedicle screws bilaterally at each level and bone cages in the L2-3, L3-4 and L4-5 disc spaces. Atherosclerotic calcifications are present in the abdominal aorta. IMPRESSION: Intraoperative fluoroscopic guidance for L2-5 bilateral posterior lumbar spine fusion. Electronically Signed   By: Ilona Sorrel M.D.   On: 01/05/2015 12:55   Dg C-arm Gt 120 Min  01/05/2015  CLINICAL DATA:  L2-5 posterior lumbar fusion EXAM: DG C-ARM GT 120 MIN; LUMBAR SPINE - 2-3 VIEW COMPARISON:  12/19/2014 spine radiographs. FINDINGS: Fluoroscopy time 7 minutes 21 seconds. For spot fluoroscopic intraoperative nondiagnostic radiographs were provided, which demonstrate postsurgical changes from bilateral posterior lumbar fusion from L2-L5 with pedicle screws bilaterally at each level and bone cages in the L2-3, L3-4 and L4-5 disc spaces. Atherosclerotic calcifications are present in the abdominal aorta. IMPRESSION: Intraoperative fluoroscopic guidance for L2-5 bilateral posterior lumbar spine fusion. Electronically Signed   By: Ilona Sorrel M.D.   On: 01/05/2015 12:55    Assessment/Plan: Improving.  Better pain control, less confusion and nausea.  No more episodes of hypoxia.  I appreciate Dr. Janit Pagan assistance, as does the family.  Mobilize with PT.  Cipro for dysuria.    LOS: 2 days    Peggyann Shoals, MD 01/07/2015, 9:27 AM

## 2015-01-07 NOTE — Consult Note (Signed)
Name: Julian Moran MRN: [id number] DOB: 06-29-1945    ADMISSION DATE:  01/05/2015 CONSULTATION DATE:  01/05/15  REFERRING MD :  Vickie Epley, MD  CHIEF COMPLAINT:  Acute hypoxia   BRIEF PATIENT DESCRIPTION: 69 post op L spine surgery, untreated OSA, with acute hypoxia from mucous plug  SIGNIFICANT EVENTS  10/27 post op L spine, hypoxia resolved after suctioning  STUDIES:  pcxr 10/28>>> Neck xray 10/28>>> neg  HISTORY OF PRESENT ILLNESS:  69 yr old obese male back pain, osa not compliant with therapy presented elective L spine surgery multilevel with screws placed. About 2 hrs post op with acute episode hypoxia, RN wife stated he "was blue", extensive suctioning took place and resolved and reoccurred once further. He was post op and lethargic with pain control meds and residual anesthetics. No hemoptysis, was a long OR case 5 hrs with downward position. He has been RX cpap but refuses. He remains on 3 liters and has not ambulated yet. No CP, N/ V.  SUBJECTIVE: no distress, no SOB now. Had coughed up at least one large mucus plug while upright  VITAL SIGNS: Temp:  [97.9 F (36.6 C)-98.6 F (37 C)] 98.1 F (36.7 C) (10/29 0915) Pulse Rate:  [63-90] 63 (10/29 0915) Resp:  [18-20] 18 (10/29 0915) BP: (106-129)/(54-71) 129/66 mmHg (10/29 0915) SpO2:  [95 %-98 %] 95 % (10/29 0915) Weight:  [106 kg (233 lb 11 oz)] 106 kg (233 lb 11 oz) (10/28 1236)  PHYSICAL EXAMINATION: General:  Awake now, no distress, lying supine Neuro:  Perr, strength equal All ext HEENT:  No stridor now, no upper airway sounds abnormal, gutteral voice quality Cardiovascular:  s1 s2 RRR distant Lungs:  CTA anterior Abdomen:  Soft, obese, NT, ND Musculoskeletal:  No edema Skin:  No rash   Recent Labs Lab 01/04/15 1300 01/06/15 1404  NA 137 133*  K 3.4* 3.6  CL 102 95*  CO2 24 28  BUN 10 9  CREATININE 1.03 1.11  GLUCOSE 97 85    Recent Labs Lab 01/04/15 1300  HGB 15.3  HCT 43.8  WBC 5.9    PLT 172   Dg Neck Soft Tissue  01/06/2015  CLINICAL DATA:  Hypoxia. EXAM: NECK SOFT TISSUES - 1+ VIEW COMPARISON:  None. FINDINGS: There is no evidence of retropharyngeal soft tissue swelling or epiglottic enlargement. The cervical airway is unremarkable and no radio-opaque foreign body identified. There is moderate cervical spondylosis and slight anterior spurring, but not hypertrophic. Mild degenerative anterolisthesis C2 on C3. IMPRESSION: Negative for airway obstruction. Electronically Signed   By: Staci Righter M.D.   On: 01/06/2015 17:25   Dg Chest 1 View  01/06/2015  CLINICAL DATA:  Hypoxia EXAM: CHEST 1 VIEW COMPARISON:  None. FINDINGS: Lungs are clear.  No pleural effusion or pneumothorax. The heart is top-normal in size. Right shoulder arthroplasty. IMPRESSION: No evidence of acute cardiopulmonary disease. Electronically Signed   By: Julian Hy M.D.   On: 01/06/2015 17:28   Dg Lumbar Spine 2-3 Views  01/05/2015  CLINICAL DATA:  L2-5 posterior lumbar fusion EXAM: DG C-ARM GT 120 MIN; LUMBAR SPINE - 2-3 VIEW COMPARISON:  12/19/2014 spine radiographs. FINDINGS: Fluoroscopy time 7 minutes 21 seconds. For spot fluoroscopic intraoperative nondiagnostic radiographs were provided, which demonstrate postsurgical changes from bilateral posterior lumbar fusion from L2-L5 with pedicle screws bilaterally at each level and bone cages in the L2-3, L3-4 and L4-5 disc spaces. Atherosclerotic calcifications are present in the abdominal aorta. IMPRESSION: Intraoperative fluoroscopic guidance  for L2-5 bilateral posterior lumbar spine fusion. Electronically Signed   By: Ilona Sorrel M.D.   On: 01/05/2015 12:55   Dg C-arm Gt 120 Min  01/05/2015  CLINICAL DATA:  L2-5 posterior lumbar fusion EXAM: DG C-ARM GT 120 MIN; LUMBAR SPINE - 2-3 VIEW COMPARISON:  12/19/2014 spine radiographs. FINDINGS: Fluoroscopy time 7 minutes 21 seconds. For spot fluoroscopic intraoperative nondiagnostic radiographs were  provided, which demonstrate postsurgical changes from bilateral posterior lumbar fusion from L2-L5 with pedicle screws bilaterally at each level and bone cages in the L2-3, L3-4 and L4-5 disc spaces. Atherosclerotic calcifications are present in the abdominal aorta. IMPRESSION: Intraoperative fluoroscopic guidance for L2-5 bilateral posterior lumbar spine fusion. Electronically Signed   By: Ilona Sorrel M.D.   On: 01/05/2015 12:55    ASSESSMENT / PLAN:  Acute Mucous plug upper airway, hypopharynx secondary to untreated OSA, narcotics (post op) resulting in hypoxia that resolved with deep suction OSA with noncompliance > cites severe claustrophobia. Discussed oral appliances as eventual alternative  S/p Extensive lumbar surgery - IS -flutter valve important> He says RT brought 2 defective ones. Will reorder. He should take this home. -add high dose mucinex -hold ACEI for now with upper airway concerns, likley can restart upon dc - pcxr assess atx->clear -get neck soft tissue xray> no soft tissue obstruction -re assess bmet for K> ok 3.6 -re add pulse x continuous x 24 hr -will need ambulatory pre dc pulse ox -limit narcs as able  -if more mucopus plug, add mucomyst's nebs> not needed for now -PT per NS -preventative NTS order  Fully updated wife and pt in room  CD Annamaria Boots, MD, FACP Pgr: [phone]   After 3:00 PM   [phone] [facility]  01/07/2015, 9:36 AM

## 2015-01-07 NOTE — Progress Notes (Signed)
Occupational Therapy Treatment Patient Details Name: Julian Moran MRN: [id number] DOB: January 22, 1946 Today's Date: 01/07/2015    History of present illness This 69 y.o. male admitted for Anterior lateral lumbar fusion L2-3, L3-4, L4-5 due to scoliosis, stenosis, DDD, spondylolisthesis, radiculopathy.  PMH includes:  OSA, HTN, prostate CA, hearing loss, GERD    OT comments  Pt instructed in use of AE for LB ADLs and able to perform with min guard assist.  Pain 8/10  Follow Up Recommendations  No OT follow up;Supervision/Assistance - 24 hour    Equipment Recommendations  3 in 1 bedside comode    Recommendations for Other Services      Precautions / Restrictions Precautions Precautions: Back;Fall Precaution Booklet Issued: Yes (comment) Precaution Comments: Pt requires min verbal cues to adhere to precautions  Required Braces or Orthoses: Spinal Brace Spinal Brace: Lumbar corset;Applied in sitting position       Mobility Bed Mobility Overal bed mobility: Needs Assistance Bed Mobility: Rolling;Sidelying to Sit;Sit to Sidelying Rolling: Supervision Sidelying to sit: Supervision     Sit to sidelying: Supervision General bed mobility comments: Pt demonstrates good technique.  Instructed pt to place pillow underneath knees when supine and between legs when sidelying   Transfers Overall transfer level: Needs assistance   Transfers: Sit to/from Stand;Stand Pivot Transfers Sit to Stand: Min guard Stand pivot transfers: Min guard            Balance Overall balance assessment: Needs assistance Sitting-balance support: Feet supported Sitting balance-Leahy Scale: Good     Standing balance support: During functional activity Standing balance-Leahy Scale: Poor                     ADL Overall ADL's : Needs assistance/impaired       Grooming Details (indicate cue type and reason): Pt able to state proper technique for oral care and shaving      Lower Body  Bathing: Sit to/from stand;Min guard       Lower Body Dressing: Sit to/from stand;With adaptive equipment;Min guard               Functional mobility during ADLs: Min guard General ADL Comments: Pt and wife were instructed in use and acquisition of AE.  Pt does not wear socks so was not instructed in use of sock aid       Vision                     Perception     Praxis      Cognition   Behavior During Therapy: WFL for tasks assessed/performed Overall Cognitive Status: Within Functional Limits for tasks assessed                       Extremity/Trunk Assessment               Exercises     Shoulder Instructions       General Comments      Pertinent Vitals/ Pain       Pain Assessment: 0-10 Pain Score: 8  Pain Location: back  Pain Descriptors / Indicators: Aching;Grimacing Pain Intervention(s): Monitored during session;Limited activity within patient's tolerance  Home Living                                          Prior Functioning/Environment  Frequency Min 2X/week     Progress Toward Goals  OT Goals(current goals can now be found in the care plan section)  Progress towards OT goals: Progressing toward goals  ADL Goals Pt Will Perform Grooming: with supervision;standing Pt Will Perform Upper Body Bathing: with set-up;with supervision;sitting Pt Will Perform Lower Body Bathing: with min guard assist;with adaptive equipment;sit to/from stand Pt Will Perform Upper Body Dressing: with supervision;sitting Pt Will Perform Lower Body Dressing: with min guard assist;with adaptive equipment;sit to/from stand Pt Will Transfer to Toilet: with supervision;ambulating;regular height toilet;bedside commode;grab bars Pt Will Perform Toileting - Clothing Manipulation and hygiene: with supervision;with adaptive equipment;sit to/from stand Pt Will Perform Tub/Shower Transfer: Shower transfer;with min guard  assist;ambulating;shower seat;3 in 1;rolling walker  Plan Discharge plan remains appropriate    Co-evaluation                 End of Session Equipment Utilized During Treatment: Rolling walker;Back brace   Activity Tolerance Patient tolerated treatment well   Patient Left in bed;with call bell/phone within reach;with bed alarm set;with family/visitor present   Nurse Communication Mobility status        Time: 0254-2706 OT Time Calculation (min): 23 min  Charges: OT General Charges $OT Visit: 1 Procedure OT Treatments $Self Care/Home Management : 23-37 mins  Blen Ransome M 01/07/2015, 5:43 PM

## 2015-01-08 MED ORDER — ASPIRIN EC 81 MG PO TBEC
81.0000 mg | DELAYED_RELEASE_TABLET | Freq: Every day | ORAL | Status: DC
  Administered 2015-01-08 – 2015-01-09 (×2): 81 mg via ORAL
  Filled 2015-01-08: qty 1

## 2015-01-08 NOTE — Progress Notes (Signed)
Name: Julian Moran MRN: [id number] DOB: 06-18-1945    ADMISSION DATE:  01/05/2015 CONSULTATION DATE:  01/05/15  REFERRING MD :  Vickie Epley, MD  CHIEF COMPLAINT:  Acute hypoxia   BRIEF PATIENT DESCRIPTION: 41 post op L spine surgery, untreated OSA, with acute hypoxia from mucous plug  SIGNIFICANT EVENTS  10/27 post op L spine, hypoxia resolved after suctioning  STUDIES:  pcxr 10/28>>> Neck xray 10/28>>> neg  HISTORY OF PRESENT ILLNESS:  69 yr old obese male back pain, osa not compliant with therapy presented elective L spine surgery multilevel with screws placed. About 2 hrs post op with acute episode hypoxia, RN wife stated he "was blue", extensive suctioning took place and resolved and reoccurred once further. He was post op and lethargic with pain control meds and residual anesthetics. No hemoptysis, was a long OR case 5 hrs with downward position. He has been RX cpap but refuses. He remains on 3 liters and has not ambulated yet. No CP, N/ V.  SUBJECTIVE: no distress, no SOB now. Wife in room. No acute events. Flutter device helps some. No more expectorated plugs. Ambulated some yesterday  VITAL SIGNS: Temp:  [98 F (36.7 C)-100 F (37.8 C)] 98.1 F (36.7 C) (10/30 0153) Pulse Rate:  [63-82] 74 (10/30 0153) Resp:  [18] 18 (10/30 0153) BP: (122-140)/(50-74) 137/73 mmHg (10/30 0153) SpO2:  [94 %-99 %] 98 % (10/30 0153)  PHYSICAL EXAMINATION: General:  Awake now, no distress, lying right side, conversational.  Neuro:  Perr, strength equal All ext HEENT:  No stridor now, no upper airway sounds abnormal,  Cardiovascular:  s1 s2 RRR distant Lungs:  CTA anterior Abdomen:  Soft, obese, NT, ND Musculoskeletal:  No edema Skin:  No rash   Recent Labs Lab 01/04/15 1300 01/06/15 1404  NA 137 133*  K 3.4* 3.6  CL 102 95*  CO2 24 28  BUN 10 9  CREATININE 1.03 1.11  GLUCOSE 97 85    Recent Labs Lab 01/04/15 1300  HGB 15.3  HCT 43.8  WBC 5.9  PLT 172   Dg Neck  Soft Tissue  01/06/2015  CLINICAL DATA:  Hypoxia. EXAM: NECK SOFT TISSUES - 1+ VIEW COMPARISON:  None. FINDINGS: There is no evidence of retropharyngeal soft tissue swelling or epiglottic enlargement. The cervical airway is unremarkable and no radio-opaque foreign body identified. There is moderate cervical spondylosis and slight anterior spurring, but not hypertrophic. Mild degenerative anterolisthesis C2 on C3. IMPRESSION: Negative for airway obstruction. Electronically Signed   By: Staci Righter M.D.   On: 01/06/2015 17:25   Dg Chest 1 View  01/06/2015  CLINICAL DATA:  Hypoxia EXAM: CHEST 1 VIEW COMPARISON:  None. FINDINGS: Lungs are clear.  No pleural effusion or pneumothorax. The heart is top-normal in size. Right shoulder arthroplasty. IMPRESSION: No evidence of acute cardiopulmonary disease. Electronically Signed   By: Julian Hy M.D.   On: 01/06/2015 17:28    ASSESSMENT / PLAN:  Acute Mucous plug upper airway, hypopharynx secondary to untreated OSA, narcotics (post op) resulting in hypoxia that resolved with deep suction OSA with noncompliance > cites severe claustrophobia preventing CPAP. Discussed oral appliances as eventual alternative  S/p Extensive lumbar surgery - IS -flutter valve important>  He should take this home. -add high dose mucinex -hold ACEI for now with upper airway concerns, can restart upon dc -re assess bmet for K> ok 3.6 -re add pulse x continuous x 24 hr -will need ambulatory pre dc pulse ox -limit narcs  as able  -if more mucus plug, add mucomyst's nebs> not needed for now -PT per NS -preventative NTS order  Fully updated wife and pt in room  CD Annamaria Boots, MD, FACP Pgr: [phone]   After 3:00 PM   [phone] [facility]  01/08/2015, 8:52 AM

## 2015-01-08 NOTE — Progress Notes (Signed)
Physical Therapy Treatment Patient Details Name: Julian Moran MRN: [id number] DOB: 01-31-1946 Today's Date: 01/08/2015    History of Present Illness This 70 y.o. male admitted for Anterior lateral lumbar fusion L2-3, L3-4, L4-5 due to scoliosis, stenosis, DDD, spondylolisthesis, radiculopathy.  PMH includes:  OSA, HTN, prostate CA, hearing loss, GERD     PT Comments    Pt making steady progress toward goals. Stair education completed today. Still needs reminder cues to adhere to back precautions.  Follow Up Recommendations  No PT follow up     Equipment Recommendations  None recommended by PT    Precautions / Restrictions Precautions Precautions: Back;Fall Precaution Comments: Pt requires min verbal cues to adhere to precautions  Required Braces or Orthoses: Spinal Brace Spinal Brace: Lumbar corset;Applied in sitting position (pt independent with donning brace) Restrictions Weight Bearing Restrictions: No    Mobility  Bed Mobility       Sidelying to sit: Supervision       General bed mobility comments: pt with heavy reliance on bed rail to go from side lying to sitting edge of bed. spouse reports they have a bedside table he can use.   Transfers Overall transfer level: Needs assistance Equipment used: Rolling walker (2 wheeled) Transfers: Sit to/from Stand Sit to Stand: Supervision         General transfer comment: cues on hand placement and anterior weight shifting to assist with standing up  Ambulation/Gait Ambulation/Gait assistance: Supervision Ambulation Distance (Feet): 250 Feet Assistive device: Rolling walker (2 wheeled) Gait Pattern/deviations: Step-through pattern;Decreased stride length;Narrow base of support Gait velocity: decreased Gait velocity interpretation: Below normal speed for age/gender General Gait Details: cues on walker position with gait, especially with turns   Stairs Stairs: Yes Stairs assistance: Min guard Stair  Management: One rail Right;Step to pattern;Sideways Number of Stairs: 6 General stair comments: demo'd technique to pt. and pt able to return demo with no cues or assist needed.      Cognition Arousal/Alertness: Awake/alert Behavior During Therapy: WFL for tasks assessed/performed Overall Cognitive Status: Within Functional Limits for tasks assessed          Pertinent Vitals/Pain Pain Assessment: 0-10 Pain Score: 8  Pain Location: back Pain Descriptors / Indicators: Aching;Sore Pain Intervention(s): Limited activity within patient's tolerance;Monitored during session;Premedicated before session;Repositioned     PT Goals (current goals can now be found in the care plan section) Acute Rehab PT Goals Patient Stated Goal: be able to walk >25 yards PT Goal Formulation: With patient Time For Goal Achievement: 01/09/15 Potential to Achieve Goals: Good Progress towards PT goals: Progressing toward goals    Frequency  Min 5X/week    PT Plan Current plan remains appropriate    End of Session Equipment Utilized During Treatment: Gait belt;Back brace Activity Tolerance: Patient tolerated treatment well Patient left: in chair;with call bell/phone within reach;with chair alarm set;with family/visitor present     Time: 3734-2876 PT Time Calculation (min) (ACUTE ONLY): 24 min  Charges:  $Gait Training: 8-22 mins $Therapeutic Activity: 8-22 mins                     Willow Ora 01/08/2015, 9:55 AM   Willow Ora, PTA, [place] [phone] 01/08/15, 9:55 AM

## 2015-01-08 NOTE — Progress Notes (Signed)
Subjective: Patient reports feeling much better.  No leg pain.  Mild incisional pain  Objective: Vital signs in last 24 hours: Temp:  [98 F (36.7 C)-100 F (37.8 C)] 99 F (37.2 C) (10/30 0902) Pulse Rate:  [65-82] 77 (10/30 0902) Resp:  [18] 18 (10/30 0902) BP: (118-140)/(63-74) 118/63 mmHg (10/30 0902) SpO2:  [96 %-99 %] 96 % (10/30 0902)  Intake/Output from previous day: 10/29 0701 - 10/30 0700 In: -  Out: 450 [Urine:450] Intake/Output this shift: Total I/O In: 3 [I.V.:3] Out: -   Physical Exam: Strength full.  Sitting upright in bed.  Ambulating in hall with assistance.  Lab Results: No results for input(s): WBC, HGB, HCT, PLT in the last 72 hours. BMET  Recent Labs  01/06/15 1404  NA 133*  K 3.6  CL 95*  CO2 28  GLUCOSE 85  BUN 9  CREATININE 1.11  CALCIUM 8.9    Studies/Results: Dg Neck Soft Tissue  01/06/2015  CLINICAL DATA:  Hypoxia. EXAM: NECK SOFT TISSUES - 1+ VIEW COMPARISON:  None. FINDINGS: There is no evidence of retropharyngeal soft tissue swelling or epiglottic enlargement. The cervical airway is unremarkable and no radio-opaque foreign body identified. There is moderate cervical spondylosis and slight anterior spurring, but not hypertrophic. Mild degenerative anterolisthesis C2 on C3. IMPRESSION: Negative for airway obstruction. Electronically Signed   By: Staci Righter M.D.   On: 01/06/2015 17:25   Dg Chest 1 View  01/06/2015  CLINICAL DATA:  Hypoxia EXAM: CHEST 1 VIEW COMPARISON:  None. FINDINGS: Lungs are clear.  No pleural effusion or pneumothorax. The heart is top-normal in size. Right shoulder arthroplasty. IMPRESSION: No evidence of acute cardiopulmonary disease. Electronically Signed   By: Julian Hy M.D.   On: 01/06/2015 17:28    Assessment/Plan: Making good progress.  Wants to do another day of PT prior to discharge.  Would like home PT.    LOS: 3 days    Christeen Lai D, MD 01/08/2015, 1:11 PM

## 2015-01-09 MED ORDER — DIAZEPAM 5 MG PO TABS: 5.0000 mg | ORAL_TABLET | Freq: Four times a day (QID) | ORAL | Status: DC | PRN

## 2015-01-09 MED ORDER — CIPROFLOXACIN HCL 500 MG PO TABS: 500.0000 mg | ORAL_TABLET | Freq: Two times a day (BID) | ORAL | Status: DC

## 2015-01-09 MED ORDER — OXYCODONE-ACETAMINOPHEN 5-325 MG PO TABS: 1.0000 | ORAL_TABLET | ORAL | Status: DC | PRN

## 2015-01-09 NOTE — Care Management Note (Signed)
Case Management Note  Patient Details  Name: Julian Moran MRN: [id number] Date of Birth: 1945-12-16  Subjective/Objective:                    Action/Plan: Patient being discharged home today with an order for home health PT. CM met with the patient and his wife and provided them a list of home health agencies in the [facility] area. They selected [facility]. Miranda with [facility] notified and accepted the referral. Pt also to have a 3 in 1 for home. Jermaine with [facility] DME notified and is going to deliver the DME to the room. Patient and wife aware not to leave without equipment. Will update bedside RN.   Expected Discharge Date:                  Expected Discharge Plan:  [place]  In-House Referral:     Discharge planning Services  CM Consult  Post Acute Care Choice:    Choice offered to:  Patient, Spouse  DME Arranged:  3-N-1 DME Agency:  [place]:  PT [place]:  [place]  Status of Service:  Completed, signed off  Medicare Important Message Given:    Date Medicare IM Given:    Medicare IM give by:    Date Additional Medicare IM Given:    Additional Medicare Important Message give by:     If discussed at [place] of Stay Meetings, dates discussed:    Additional Comments:  Pollie Friar, RN 01/09/2015, 9:59 AM

## 2015-01-09 NOTE — Care Management Important Message (Signed)
Important Message  Patient Details  Name: Julian Moran MRN: [id number] Date of Birth: Oct 23, 1945   Medicare Important Message Given:  Yes-second notification given    Nathen May 01/09/2015, 5:35 PM

## 2015-01-09 NOTE — Progress Notes (Signed)
Subjective: Patient reports "I feel ok"  Objective: Vital signs in last 24 hours: Temp:  [97.9 F (36.6 C)-99 F (37.2 C)] 98.6 F (37 C) (10/31 0534) Pulse Rate:  [75-85] 75 (10/31 0534) Resp:  [18-20] 20 (10/31 0534) BP: (117-143)/(63-88) 120/64 mmHg (10/31 0534) SpO2:  [93 %-96 %] 95 % (10/31 0534)  Intake/Output from previous day: 10/30 0701 - 10/31 0700 In: 3 [I.V.:3] Out: -  Intake/Output this shift:    Alert, conversant. Wife present. Reports significantly improved lumbar pain. Mild burning with urination is decreasing. (Cipro continues.)  Strength is good BLE. Hopeful of HHPT for safety instructions/home eval. Incisions without erythema, swelling, or drainage. Mild bruising fading.   Lab Results: No results for input(s): WBC, HGB, HCT, PLT in the last 72 hours. BMET  Recent Labs  01/06/15 1404  NA 133*  K 3.6  CL 95*  CO2 28  GLUCOSE 85  BUN 9  CREATININE 1.11  CALCIUM 8.9    Studies/Results: No results found.  Assessment/Plan: Improving   LOS: 4 days  Per Dr. Vertell Limber, d/c IV, d/c to home. Hopeful of HHPT for safety eval/instruction.  Percocet, Valium, and Cipro rx's to chart for home use. Pt will call offie to schedule 3-4 week f/u visit. Pt & wife verbalize understanding of d/c instructions.    Verdis Prime 01/09/2015, 7:50 AM

## 2015-01-09 NOTE — Progress Notes (Signed)
Physical Therapy Treatment Patient Details Name: Julian Moran MRN: [id number] DOB: 05-15-1945 Today's Date: 01/09/2015    History of Present Illness This 69 y.o. male admitted for Anterior lateral lumbar fusion L2-3, L3-4, L4-5 due to scoliosis, stenosis, DDD, spondylolisthesis, radiculopathy.  PMH includes:  OSA, HTN, prostate CA, hearing loss, GERD     PT Comments    Pt is demonstrating improved standing control but confused over instructions for brace.  Gave him a review of all instructions, then he demonstrated independent walking in room and declined trip to hall due to his meds.  Follow Up Recommendations  No PT follow up     Equipment Recommendations  None recommended by PT    Recommendations for Other Services       Precautions / Restrictions Precautions Precautions: Back;Fall Precaution Booklet Issued: Yes (comment) Precaution Comments: able to recall 3/3 precautions Required Braces or Orthoses: Spinal Brace Spinal Brace: Lumbar corset;Applied in sitting position Restrictions Weight Bearing Restrictions: No    Mobility  Bed Mobility Overal bed mobility: Modified Independent Bed Mobility: Supine to Sit Rolling: Supervision            Transfers Overall transfer level: Modified independent Equipment used: Rolling walker (2 wheeled) Transfers: Sit to/from [facility] Sit to Stand: Supervision Stand pivot transfers: Supervision       General transfer comment: pt is prompted to reapply brace for use with mobilty  Ambulation/Gait Ambulation/Gait assistance: Supervision Ambulation Distance (Feet): 40 Feet (concerned about being woozy with meds) Assistive device: Rolling walker (2 wheeled) Gait Pattern/deviations: Wide base of support;Trunk flexed;Decreased stride length;Step-through pattern Gait velocity: decreased Gait velocity interpretation: Below normal speed for age/gender     Stairs            Wheelchair Mobility     Modified Rankin (Stroke Patients Only)       Balance Overall balance assessment: Modified Independent Sitting-balance support: Feet supported Sitting balance-Leahy Scale: Good       Standing balance-Leahy Scale: Fair                      Cognition Arousal/Alertness: Lethargic (Pt states he had lots of medication) Behavior During Therapy: WFL for tasks assessed/performed Overall Cognitive Status: Impaired/Different from baseline (removed back brace to go to BR) Area of Impairment: Awareness;Problem solving;Safety/judgement         Safety/Judgement: Decreased awareness of safety;Decreased awareness of deficits Awareness: Intellectual Problem Solving: Slow processing General Comments: explained to pt that he can go to BR without brace but not to remove it to go to BR    Exercises      General Comments General comments (skin integrity, edema, etc.): Pt is somewhat medicated but did cover safety and body mechanics review including chair and bed positioning      Pertinent Vitals/Pain Pain Assessment: No/denies pain Pain Score: 4  Pain Location: back Pain Descriptors / Indicators: Aching Pain Intervention(s): Limited activity within patient's tolerance    Home Living                      Prior Function            PT Goals (current goals can now be found in the care plan section) Acute Rehab PT Goals Patient Stated Goal: to get home today Progress towards PT goals: Progressing toward goals    Frequency  Min 5X/week    PT Plan Current plan remains appropriate    Co-evaluation  End of Session Equipment Utilized During Treatment: Back brace Activity Tolerance: Patient tolerated treatment well Patient left: in bed;with call bell/phone within reach;with family/visitor present (sitting bedside)     Time: 1017-5102 PT Time Calculation (min) (ACUTE ONLY): 14 min  Charges:  $Therapeutic Activity: 8-22 mins                     G Codes:      Julian Moran Jan 15, 2015, 12:47 PM   Julian Moran, PT MS Acute Rehab Dept. Number: [facility] [phone] and [place] [phone]

## 2015-01-09 NOTE — Progress Notes (Signed)
Occupational Therapy Treatment Patient Details Name: Julian Moran MRN: [id number] DOB: 03-Jul-1945 Today's Date: 01/09/2015    History of present illness This 69 y.o. male admitted for Anterior lateral lumbar fusion L2-3, L3-4, L4-5 due to scoliosis, stenosis, DDD, spondylolisthesis, radiculopathy.  PMH includes:  OSA, HTN, prostate CA, hearing loss, GERD    OT comments  Completed education with wife/pt regarding use of AE/DME and compensatory techniques for ADL. Pt/wife able to verbalize/demonstrate understanding. CM contacted regarding need for 3 in 1. Pt requesting HHPT. CM notified. No further OT needed. OT signing off.   Follow Up Recommendations  No OT follow up;Supervision/Assistance - 24 hour    Equipment Recommendations  3 in 1 bedside comode    Recommendations for Other Services      Precautions / Restrictions Precautions Precautions: Back;Fall Precaution Comments: able to recall 3/3 precautions Required Braces or Orthoses: Spinal Brace Spinal Brace: Lumbar corset;Applied in sitting position       Mobility Bed Mobility Pt OOB in chair                                                                           ADL                                         General ADL Comments: Reviewed back precautions with pt/wife. Answered questions wife had from previous session. REcommend pt use 3 in 1 for bathing. REcommended pt use reacher and long handled sponge for ADL. Educated on home safety and reducing risk of falls. Pt/wife verbalied understanding. Pt ambulating with wife this am and she assisting with bathing and dressing.                                       Cognition   Behavior During Therapy: WFL for tasks assessed/performed Overall Cognitive Status: Within Functional Limits for tasks assessed                                                 General Comments       Pertinent Vitals/ Pain       Pain Assessment: 0-10 Pain Score: 4  Pain Location: back Pain Descriptors / Indicators: Aching Pain Intervention(s): Limited activity within patient's tolerance  Home Living                                          Prior Functioning/Environment              Frequency       Progress Toward Goals  OT Goals(current goals can now be found in the care plan section)  Progress towards OT goals: Goals met/education completed, patient discharged from OT  Acute Rehab OT Goals Patient Stated Goal: be able to walk >25 yards OT Goal  Formulation: With patient/family Time For Goal Achievement: 01/13/15 Potential to Achieve Goals: Good ADL Goals Pt Will Perform Grooming: with supervision;standing Pt Will Perform Upper Body Bathing: with set-up;with supervision;sitting Pt Will Perform Lower Body Bathing: with min guard assist;with adaptive equipment;sit to/from stand Pt Will Perform Upper Body Dressing: with supervision;sitting Pt Will Perform Lower Body Dressing: with min guard assist;with adaptive equipment;sit to/from stand Pt Will Transfer to Toilet: with supervision;ambulating;regular height toilet;bedside commode;grab bars Pt Will Perform Toileting - Clothing Manipulation and hygiene: with supervision;with adaptive equipment;sit to/from stand Pt Will Perform Tub/Shower Transfer: Shower transfer;with min guard assist;ambulating;shower seat;3 in 1;rolling walker  Plan Discharge plan remains appropriate;All goals met and education completed, patient discharged from OT services    Co-evaluation                 End of Session Equipment Utilized During Treatment: Back brace   Activity Tolerance Patient tolerated treatment well   Patient Left in chair;with call bell/phone within reach;with family/visitor present   Nurse Communication Other (comment) (need for 3 in 1 - CM)        Time: 4599-7741 OT Time Calculation (min):  17 min  Charges: OT General Charges $OT Visit: 1 Procedure OT Treatments $Self Care/Home Management : 8-22 mins  Skarleth Delmonico,HILLARY 01/09/2015, 9:36 AM   Maurie Boettcher, OTR/L  [phone] 01/09/2015

## 2015-01-09 NOTE — Progress Notes (Signed)
Pt discharging at this time with his wife alert, verbal taking all personal belongings. IV discontinued, dry dressing applied. Discharge instructions and prescriptions provided with verbal understanding.  Pt to receive [place] services. Pt discharging with 3 in 1 commode. No noted distress.

## 2015-01-09 NOTE — Discharge Summary (Signed)
Physician Discharge Summary  Patient ID: ZURICH CARRENO MRN: [id number] DOB/AGE: Aug 16, 1945 69 y.o.  Admit date: 01/05/2015 Discharge date: 01/09/2015  Admission Diagnoses: scoliosis - stenosis - DDD, spondylolisthesis, radiculopathy   Discharge Diagnoses: scoliosis - stenosis - DDD, spondylolisthesis, radiculopathy s/p Anterior lateral lumbar fusion lumbar two-lumbar three, lumbar three-lumbar four, lumbar four-lumbar five left with percutaneous pedicle screws lumbar two through five (Left) - Anterior lateral lumbar fusion lumbar two-lumbar three, lumbar three-lumbar four, lumbar four-lumbar five left with percutaneous pedicle screws lumbar two through five LUMBAR PERCUTANEOUS PEDICLE SCREW 3 LEVEL   Active Problems:   Lumbar spine scoliosis   Hypoxia   Mucus plugging of bronchi   Obstructive sleep apnea   Discharged Condition: good  Hospital Course: Julian Moran was admitted for surgery with dx scoliosis, stenosis, radiculopathy. Following uncomplicated anterolateral fusion, he recovered well and transferred to [facility] for nursing care and therapies. He experienced one hypoxic episode thought to be caused by a mucous plug. Critical Care consulted.  No recurrence. Pt using flutter valve and will continue at home.   Consults: pulmonary/intensive care  Significant Diagnostic Studies: radiology: X-Ray: intra-operative  Treatments: surgery: Anterior lateral lumbar fusion lumbar two-lumbar three, lumbar three-lumbar four, lumbar four-lumbar five left with percutaneous pedicle screws lumbar two through five (Left) - Anterior lateral lumbar fusion lumbar two-lumbar three, lumbar three-lumbar four, lumbar four-lumbar five left with percutaneous pedicle screws lumbar two through five [place] 3 LEVEL   Discharge Exam: Blood pressure 120/64, pulse 75, temperature 98.6 F (37 C), temperature source Oral, resp. rate 20, height 5\' 8"  (1.727 m), weight 106 kg (233 lb 11  oz), SpO2 95 %. Alert, conversant. Wife present. Reports significantly improved lumbar pain. Mild burning with urination is decreasing. (Cipro continues.)  Strength is good BLE. Hopeful of HHPT for safety instructions/home eval. Incisions without erythema, swelling, or drainage. Mild bruising fading.    Disposition: Final discharge disposition not confirmed     Medication List    ASK your doctor about these medications        allopurinol 300 MG tablet  Commonly known as:  ZYLOPRIM  Take 300 mg by mouth daily.     aspirin 81 MG tablet  Take 81 mg by mouth daily.     colchicine 0.6 MG tablet  Take 0.6 mg by mouth daily as needed. gout     FISH OIL PO  Take by mouth daily.     indapamide 2.5 MG tablet  Commonly known as:  LOZOL  Take 2.5 mg by mouth every morning.     lisinopril 40 MG tablet  Commonly known as:  PRINIVIL,ZESTRIL  Take 20 mg by mouth daily.     methimazole 10 MG tablet  Commonly known as:  TAPAZOLE  Take 10 mg by mouth daily.     omeprazole 20 MG capsule  Commonly known as:  PRILOSEC  Take 20 mg by mouth daily.     simvastatin 40 MG tablet  Commonly known as:  ZOCOR  Take 20 mg by mouth daily.         Signed: Verdis Prime 01/09/2015, 7:56 AM

## 2015-01-10 DIAGNOSIS — Z6832 Body mass index (BMI) 32.0-32.9, adult: Secondary | ICD-10-CM | POA: Diagnosis not present

## 2015-01-10 DIAGNOSIS — Z96611 Presence of right artificial shoulder joint: Secondary | ICD-10-CM | POA: Diagnosis not present

## 2015-01-10 DIAGNOSIS — Z8546 Personal history of malignant neoplasm of prostate: Secondary | ICD-10-CM | POA: Diagnosis not present

## 2015-01-10 DIAGNOSIS — I10 Essential (primary) hypertension: Secondary | ICD-10-CM | POA: Diagnosis not present

## 2015-01-10 DIAGNOSIS — M5416 Radiculopathy, lumbar region: Secondary | ICD-10-CM | POA: Diagnosis not present

## 2015-01-10 DIAGNOSIS — Z4789 Encounter for other orthopedic aftercare: Secondary | ICD-10-CM | POA: Diagnosis not present

## 2015-01-10 DIAGNOSIS — M4316 Spondylolisthesis, lumbar region: Secondary | ICD-10-CM | POA: Diagnosis not present

## 2015-01-12 DIAGNOSIS — Z8546 Personal history of malignant neoplasm of prostate: Secondary | ICD-10-CM | POA: Diagnosis not present

## 2015-01-12 DIAGNOSIS — I10 Essential (primary) hypertension: Secondary | ICD-10-CM | POA: Diagnosis not present

## 2015-01-12 DIAGNOSIS — Z4789 Encounter for other orthopedic aftercare: Secondary | ICD-10-CM | POA: Diagnosis not present

## 2015-01-12 DIAGNOSIS — M5416 Radiculopathy, lumbar region: Secondary | ICD-10-CM | POA: Diagnosis not present

## 2015-01-12 DIAGNOSIS — Z96611 Presence of right artificial shoulder joint: Secondary | ICD-10-CM | POA: Diagnosis not present

## 2015-01-12 DIAGNOSIS — M4316 Spondylolisthesis, lumbar region: Secondary | ICD-10-CM | POA: Diagnosis not present

## 2015-01-13 DIAGNOSIS — I10 Essential (primary) hypertension: Secondary | ICD-10-CM | POA: Diagnosis not present

## 2015-01-13 DIAGNOSIS — Z96611 Presence of right artificial shoulder joint: Secondary | ICD-10-CM | POA: Diagnosis not present

## 2015-01-13 DIAGNOSIS — Z4789 Encounter for other orthopedic aftercare: Secondary | ICD-10-CM | POA: Diagnosis not present

## 2015-01-13 DIAGNOSIS — M4316 Spondylolisthesis, lumbar region: Secondary | ICD-10-CM | POA: Diagnosis not present

## 2015-01-13 DIAGNOSIS — Z8546 Personal history of malignant neoplasm of prostate: Secondary | ICD-10-CM | POA: Diagnosis not present

## 2015-01-13 DIAGNOSIS — M5416 Radiculopathy, lumbar region: Secondary | ICD-10-CM | POA: Diagnosis not present

## 2015-01-17 DIAGNOSIS — Z8546 Personal history of malignant neoplasm of prostate: Secondary | ICD-10-CM | POA: Diagnosis not present

## 2015-01-17 DIAGNOSIS — Z4789 Encounter for other orthopedic aftercare: Secondary | ICD-10-CM | POA: Diagnosis not present

## 2015-01-17 DIAGNOSIS — I10 Essential (primary) hypertension: Secondary | ICD-10-CM | POA: Diagnosis not present

## 2015-01-17 DIAGNOSIS — M5416 Radiculopathy, lumbar region: Secondary | ICD-10-CM | POA: Diagnosis not present

## 2015-01-17 DIAGNOSIS — Z96611 Presence of right artificial shoulder joint: Secondary | ICD-10-CM | POA: Diagnosis not present

## 2015-01-17 DIAGNOSIS — M4316 Spondylolisthesis, lumbar region: Secondary | ICD-10-CM | POA: Diagnosis not present

## 2015-01-18 ENCOUNTER — Encounter: Payer: Medicare Other | Admitting: Vascular Surgery

## 2015-01-31 ENCOUNTER — Other Ambulatory Visit (HOSPITAL_COMMUNITY): Payer: Medicare Other

## 2015-02-01 DIAGNOSIS — M4806 Spinal stenosis, lumbar region: Secondary | ICD-10-CM | POA: Diagnosis not present

## 2015-02-01 DIAGNOSIS — Z6831 Body mass index (BMI) 31.0-31.9, adult: Secondary | ICD-10-CM | POA: Diagnosis not present

## 2015-02-07 DIAGNOSIS — L820 Inflamed seborrheic keratosis: Secondary | ICD-10-CM | POA: Diagnosis not present

## 2015-02-07 DIAGNOSIS — L570 Actinic keratosis: Secondary | ICD-10-CM | POA: Diagnosis not present

## 2015-02-10 ENCOUNTER — Encounter (HOSPITAL_COMMUNITY): Admission: RE | Payer: Self-pay | Source: Ambulatory Visit

## 2015-02-10 ENCOUNTER — Inpatient Hospital Stay (HOSPITAL_COMMUNITY): Admission: RE | Admit: 2015-02-10 | Payer: Medicare Other | Source: Ambulatory Visit | Admitting: Neurosurgery

## 2015-02-10 SURGERY — POSTERIOR LUMBAR FUSION 1 LEVEL
Anesthesia: General | Site: Back

## 2015-02-15 DIAGNOSIS — R0989 Other specified symptoms and signs involving the circulatory and respiratory systems: Secondary | ICD-10-CM | POA: Diagnosis not present

## 2015-02-15 DIAGNOSIS — J069 Acute upper respiratory infection, unspecified: Secondary | ICD-10-CM | POA: Diagnosis not present

## 2015-02-15 DIAGNOSIS — J0101 Acute recurrent maxillary sinusitis: Secondary | ICD-10-CM | POA: Diagnosis not present

## 2015-02-15 DIAGNOSIS — R05 Cough: Secondary | ICD-10-CM | POA: Diagnosis not present

## 2015-02-15 DIAGNOSIS — J9801 Acute bronchospasm: Secondary | ICD-10-CM | POA: Diagnosis not present

## 2015-03-15 DIAGNOSIS — M4126 Other idiopathic scoliosis, lumbar region: Secondary | ICD-10-CM | POA: Diagnosis not present

## 2015-03-15 DIAGNOSIS — M5416 Radiculopathy, lumbar region: Secondary | ICD-10-CM | POA: Diagnosis not present

## 2015-03-15 DIAGNOSIS — M4806 Spinal stenosis, lumbar region: Secondary | ICD-10-CM | POA: Diagnosis not present

## 2015-03-15 DIAGNOSIS — M4316 Spondylolisthesis, lumbar region: Secondary | ICD-10-CM | POA: Diagnosis not present

## 2015-03-29 DIAGNOSIS — L03211 Cellulitis of face: Secondary | ICD-10-CM | POA: Diagnosis not present

## 2015-03-31 ENCOUNTER — Encounter (HOSPITAL_COMMUNITY): Payer: Self-pay

## 2015-03-31 ENCOUNTER — Emergency Department (HOSPITAL_COMMUNITY)
Admission: EM | Admit: 2015-03-31 | Discharge: 2015-03-31 | Disposition: A | Discharge status: Home / Self Care | Payer: Medicare Other | Attending: Emergency Medicine | Admitting: Emergency Medicine

## 2015-03-31 DIAGNOSIS — E785 Hyperlipidemia, unspecified: Secondary | ICD-10-CM | POA: Diagnosis not present

## 2015-03-31 DIAGNOSIS — L0201 Cutaneous abscess of face: Secondary | ICD-10-CM | POA: Diagnosis not present

## 2015-03-31 DIAGNOSIS — I10 Essential (primary) hypertension: Secondary | ICD-10-CM | POA: Diagnosis not present

## 2015-03-31 DIAGNOSIS — R51 Headache: Secondary | ICD-10-CM | POA: Diagnosis present

## 2015-03-31 DIAGNOSIS — Z792 Long term (current) use of antibiotics: Secondary | ICD-10-CM | POA: Insufficient documentation

## 2015-03-31 DIAGNOSIS — Z8669 Personal history of other diseases of the nervous system and sense organs: Secondary | ICD-10-CM | POA: Insufficient documentation

## 2015-03-31 DIAGNOSIS — H9190 Unspecified hearing loss, unspecified ear: Secondary | ICD-10-CM | POA: Insufficient documentation

## 2015-03-31 DIAGNOSIS — Z8546 Personal history of malignant neoplasm of prostate: Secondary | ICD-10-CM | POA: Diagnosis not present

## 2015-03-31 DIAGNOSIS — M1990 Unspecified osteoarthritis, unspecified site: Secondary | ICD-10-CM | POA: Diagnosis not present

## 2015-03-31 DIAGNOSIS — E0590 Thyrotoxicosis, unspecified without thyrotoxic crisis or storm: Secondary | ICD-10-CM | POA: Diagnosis not present

## 2015-03-31 DIAGNOSIS — K219 Gastro-esophageal reflux disease without esophagitis: Secondary | ICD-10-CM | POA: Insufficient documentation

## 2015-03-31 DIAGNOSIS — Z79899 Other long term (current) drug therapy: Secondary | ICD-10-CM | POA: Insufficient documentation

## 2015-03-31 MED ORDER — LIDOCAINE HCL (PF) 1 % IJ SOLN
5.0000 mL | Freq: Once | INTRAMUSCULAR | Status: AC
  Administered 2015-03-31: 5 mL via INTRADERMAL
  Filled 2015-03-31: qty 5

## 2015-03-31 MED ORDER — MUPIROCIN CALCIUM 2 % NA OINT: TOPICAL_OINTMENT | NASAL | Status: AC

## 2015-03-31 MED ORDER — SULFAMETHOXAZOLE-TRIMETHOPRIM 800-160 MG PO TABS
1.0000 | ORAL_TABLET | Freq: Once | ORAL | Status: AC
  Administered 2015-03-31: 1 via ORAL
  Filled 2015-03-31: qty 1

## 2015-03-31 MED ORDER — HYDROCODONE-ACETAMINOPHEN 5-325 MG PO TABS: ORAL_TABLET | ORAL | Status: AC

## 2015-03-31 MED ORDER — SULFAMETHOXAZOLE-TRIMETHOPRIM 800-160 MG PO TABS: 1.0000 | ORAL_TABLET | Freq: Two times a day (BID) | ORAL | Status: AC

## 2015-03-31 MED ORDER — CLINDAMYCIN HCL 150 MG PO CAPS: 450.0000 mg | ORAL_CAPSULE | Freq: Once | ORAL | Status: DC

## 2015-03-31 NOTE — ED Provider Notes (Signed)
CSN: [id number]     Arrival date & time 03/31/15  0902 History   First MD Initiated Contact with Patient 03/31/15 [phone]     No chief complaint on file.    (Consider location/radiation/quality/duration/timing/severity/associated sxs/prior Treatment) HPI   Blood pressure 137/83, pulse 70, temperature 99 F (37.2 C), temperature source Oral, resp. rate 18, height 5\' 8"  (1.727 m), weight 92.987 kg, SpO2 94 %.  Julian Moran is a 70 y.o. male complaining of worsening pain and redness and drainage to the left nares onset 1 week ago. Patient was started on Keflex by PCP 2 days ago. Reports faces more edematous upon waking this morning. Patient denies diabetes, chronic steroid use, immunosuppression, fever, chills, headache, change in vision, pain with eye movement.  Past Medical History  Diagnosis Date  . Hypertension   . Cancer [facility])     prostate  . Scoliosis   . Hyperlipidemia   . Hearing loss     wears hearing aids in both ears  . Sleep apnea     per wife  . Hyperthyroidism   . GERD (gastroesophageal reflux disease)   . Arthritis    Past Surgical History  Procedure Laterality Date  . Prostatectomy  2002  . Shoulder surgery Right november 2015  . Knee arthroscopy Bilateral   . Joint replacement  01-2014    total replacement  . Anterior lateral lumbar fusion 4 levels Left 01/05/2015    Procedure: Anterior lateral lumbar fusion lumbar two-lumbar three, lumbar three-lumbar four, lumbar four-lumbar five left with percutaneous pedicle screws lumbar two through five;  Surgeon: Erline Levine, MD;  Location: [place] [facility];  Service: Neurosurgery;  Laterality: Left;  Anterior lateral lumbar fusion lumbar two-lumbar three, lumbar three-lumbar four, lumbar four-lumbar five left with percutan  . Lumbar percutaneous pedicle screw 3 level  01/05/2015    Procedure: LUMBAR PERCUTANEOUS PEDICLE SCREW 3 LEVEL;  Surgeon: Erline Levine, MD;  Location: [place] [facility];  Service: Neurosurgery;;   History  reviewed. No pertinent family history. Social History  Substance Use Topics  . Smoking status: Never Smoker   . Smokeless tobacco: Never Used  . Alcohol Use: No    Review of Systems  10 systems reviewed and found to be negative, except as noted in the HPI.   Allergies  Review of patient's allergies indicates no known allergies.  Home Medications   Prior to Admission medications   Medication Sig Start Date End Date Taking? Authorizing Provider  allopurinol (ZYLOPRIM) 300 MG tablet Take 300 mg by mouth daily.   Yes Historical Provider, MD  cephALEXin (KEFLEX) 500 MG capsule Take 500 mg by mouth 3 (three) times daily.   Yes Historical Provider, MD  colchicine 0.6 MG tablet Take 0.6 mg by mouth daily as needed. gout   Yes Historical Provider, MD  indapamide (LOZOL) 2.5 MG tablet Take 2.5 mg by mouth every morning.   Yes Historical Provider, MD  lisinopril (PRINIVIL,ZESTRIL) 40 MG tablet Take 20 mg by mouth daily.    Yes Historical Provider, MD  methimazole (TAPAZOLE) 10 MG tablet Take 10 mg by mouth daily.   Yes Historical Provider, MD  Naproxen Sodium (ALEVE) 220 MG CAPS Take 440 mg by mouth daily as needed (pain).   Yes Historical Provider, MD  Omega-3 Fatty Acids (FISH OIL PO) Take 1 capsule by mouth daily.    Yes Historical Provider, MD  omeprazole (PRILOSEC) 20 MG capsule Take 20 mg by mouth daily.   Yes Historical Provider, MD  simvastatin (  ZOCOR) 40 MG tablet Take 20 mg by mouth daily.    Yes Historical Provider, MD   BP 137/83 mmHg  Pulse 70  Temp(Src) 99 F (37.2 C) (Oral)  Resp 18  Ht 5\' 8"  (1.727 m)  Wt 92.987 kg  BMI 31.18 kg/m2  SpO2 94% Physical Exam  Constitutional: He is oriented to person, place, and time. He appears well-developed and well-nourished. No distress.  HENT:  Head: Normocephalic and atraumatic.  Nose:    Mouth/Throat: Oropharynx is clear and moist.  Eyes: Conjunctivae and EOM are normal. Pupils are equal, round, and reactive to light.    Extraocular movement is intact without pain or diplopia  Neck: Normal range of motion.  Cardiovascular: Normal rate, regular rhythm and intact distal pulses.   Pulmonary/Chest: Effort normal and breath sounds normal.  Abdominal: Soft. There is no tenderness.  Musculoskeletal: Normal range of motion.  Neurological: He is alert and oriented to person, place, and time.  Skin: He is not diaphoretic.  Psychiatric: He has a normal mood and affect.  Nursing note and vitals reviewed.   ED Course  .Marland KitchenIncision and Drainage Date/Time: 03/31/2015 10:45 AM Performed by: Monico Blitz Authorized by: Monico Blitz Consent: Verbal consent obtained. Consent given by: patient Patient identity confirmed: verbally with patient Type: abscess Anesthesia: local infiltration Local anesthetic: lidocaine 1% without epinephrine Anesthetic total: 0.5 ml Patient sedated: no Scalpel size: 11 Incision type: single straight Incision depth: dermal Complexity: complex Drainage: purulent Drainage amount: moderate Wound treatment: wound left open Packing material: none Patient tolerance: Patient tolerated the procedure well with no immediate complications   (including critical care time) Labs Review Labs Reviewed - No data to display  Imaging Review No results found. I have personally reviewed and evaluated these images and lab results as part of my medical decision-making.   EKG Interpretation None      MDM   Final diagnoses:  Facial abscess    Filed Vitals:   03/31/15 0919  BP: 137/83  Pulse: 70  Temp: 99 F (37.2 C)  TempSrc: Oral  Resp: 18  Height: 5\' 8"  (1.727 m)  Weight: 92.987 kg  SpO2: 94%    Medications  lidocaine (PF) (XYLOCAINE) 1 % injection 5 mL (5 mLs Intradermal Given by Other 03/31/15 1001)  sulfamethoxazole-trimethoprim (BACTRIM DS,SEPTRA DS) 800-160 MG per tablet 1 tablet (1 tablet Oral Given 03/31/15 1001)    Julian Moran is 70 y.o. male presenting  with abscess inside left nares, actively draining. No signs of systemic infection, patient woke up with a puffy face this morning but there are no signs of  facial cellulitis.  I and D performed, patients with from Keflex to Bactrim, will follow with his ear nose and throat doctor in [facility]  Evaluation does not show pathology that would require ongoing emergent intervention or inpatient treatment. Pt is hemodynamically stable and mentating appropriately. Discussed findings and plan with patient/guardian, who agrees with care plan. All questions answered. Return precautions discussed and outpatient follow up given.   New Prescriptions   HYDROCODONE-ACETAMINOPHEN (NORCO/VICODIN) 5-325 MG TABLET    Take 1-2 tablets by mouth every 6 hours as needed for pain and/or cough.   MUPIROCIN NASAL OINTMENT (BACTROBAN) 2 %    Apply in each nostril daily   SULFAMETHOXAZOLE-TRIMETHOPRIM (BACTRIM DS,SEPTRA DS) 800-160 MG TABLET    Take 1 tablet by mouth 2 (two) times daily.         Monico Blitz, PA-C 03/31/15 [place], MD 04/03/15  0730 

## 2015-03-31 NOTE — ED Notes (Signed)
Pt presents with 1 week h/o pain and redness to L nare.  Pt seen at PCP and started on keflex on Wednesday, reports redness and swelling has progressed into face.  Pt denies any drainage to area, denies fever.

## 2015-03-31 NOTE — Discharge Instructions (Signed)
Take vicodin for breakthrough pain, do not drink alcohol, drive, care for children or do other critical tasks while taking vicodin.  Take your antibiotics as directed and to completion. You should never have any leftover antibiotics! Push fluids and stay well hydrated.   Please follow with your primary care doctor in the next 2 days for a check-up. They must obtain records for further management.   Do not hesitate to return to the Emergency Department for any new, worsening or concerning symptoms.     Incision and Drainage Incision and drainage is a procedure in which a sac-like structure (cystic structure) is opened and drained. The area to be drained usually contains material such as pus, fluid, or blood.  LET YOUR CAREGIVER KNOW ABOUT:   Allergies to medicine.  Medicines taken, including vitamins, herbs, eyedrops, over-the-counter medicines, and creams.  Use of steroids (by mouth or creams).  Previous problems with anesthetics or numbing medicines.  History of bleeding problems or blood clots.  Previous surgery.  Other health problems, including diabetes and kidney problems.  Possibility of pregnancy, if this applies. RISKS AND COMPLICATIONS  Pain.  Bleeding.  Scarring.  Infection. BEFORE THE PROCEDURE  You may need to have an ultrasound or other imaging tests to see how large or deep your cystic structure is. Blood tests may also be used to determine if you have an infection or how severe the infection is. You may need to have a tetanus shot. PROCEDURE  The affected area is cleaned with a cleaning fluid. The cyst area will then be numbed with a medicine (local anesthetic). A small incision will be made in the cystic structure. A syringe or catheter may be used to drain the contents of the cystic structure, or the contents may be squeezed out. The area will then be flushed with a cleansing solution. After cleansing the area, it is often gently packed with a gauze or  another wound dressing. Once it is packed, it will be covered with gauze and tape or some other type of wound dressing. AFTER THE PROCEDURE   Often, you will be allowed to go home right after the procedure.  You may be given antibiotic medicine to prevent or heal an infection.  If the area was packed with gauze or some other wound dressing, you will likely need to come back in 1 to 2 days to get it removed.  The area should heal in about 14 days.   This information is not intended to replace advice given to you by your health care provider. Make sure you discuss any questions you have with your health care provider.   Document Released: 08/21/2000 Document Revised: 08/27/2011 Document Reviewed: 04/22/2011 Elsevier Interactive Patient Education 2016 [employer]   Abscess An abscess (boil or furuncle) is an infected area on or under the skin. This area is filled with yellowish-white fluid (pus) and other material (debris). HOME CARE   Only take medicines as told by your doctor.  If you were given antibiotic medicine, take it as directed. Finish the medicine even if you start to feel better.  If gauze is used, follow your doctor's directions for changing the gauze.  To avoid spreading the infection:  Keep your abscess covered with a bandage.  Wash your hands well.  Do not share personal care items, towels, or whirlpools with others.  Avoid skin contact with others.  Keep your skin and clothes clean around the abscess.  Keep all doctor visits as told. GET HELP  RIGHT AWAY IF:   You have more pain, puffiness (swelling), or redness in the wound site.  You have more fluid or blood coming from the wound site.  You have muscle aches, chills, or you feel sick.  You have a fever. MAKE SURE YOU:   Understand these instructions.  Will watch your condition.  Will get help right away if you are not doing well or get worse.   This information is not intended to replace  advice given to you by your health care provider. Make sure you discuss any questions you have with your health care provider.   Document Released: 08/14/2007 Document Revised: 08/27/2011 Document Reviewed: 05/11/2011 Elsevier Interactive Patient Education [employer].

## 2015-04-07 DIAGNOSIS — L739 Follicular disorder, unspecified: Secondary | ICD-10-CM | POA: Diagnosis not present

## 2015-05-08 DIAGNOSIS — J342 Deviated nasal septum: Secondary | ICD-10-CM | POA: Diagnosis not present

## 2015-05-08 DIAGNOSIS — J343 Hypertrophy of nasal turbinates: Secondary | ICD-10-CM | POA: Diagnosis not present

## 2015-05-08 DIAGNOSIS — J322 Chronic ethmoidal sinusitis: Secondary | ICD-10-CM | POA: Diagnosis not present

## 2015-05-18 DIAGNOSIS — I10 Essential (primary) hypertension: Secondary | ICD-10-CM | POA: Diagnosis not present

## 2015-05-18 DIAGNOSIS — M5137 Other intervertebral disc degeneration, lumbosacral region: Secondary | ICD-10-CM | POA: Diagnosis not present

## 2015-05-18 DIAGNOSIS — K219 Gastro-esophageal reflux disease without esophagitis: Secondary | ICD-10-CM | POA: Diagnosis not present

## 2015-05-18 DIAGNOSIS — E7800 Pure hypercholesterolemia, unspecified: Secondary | ICD-10-CM | POA: Diagnosis not present

## 2015-07-07 DIAGNOSIS — J322 Chronic ethmoidal sinusitis: Secondary | ICD-10-CM | POA: Diagnosis not present

## 2015-07-07 DIAGNOSIS — J343 Hypertrophy of nasal turbinates: Secondary | ICD-10-CM | POA: Diagnosis not present

## 2015-07-07 DIAGNOSIS — J342 Deviated nasal septum: Secondary | ICD-10-CM | POA: Diagnosis not present

## 2015-07-10 DIAGNOSIS — Z6831 Body mass index (BMI) 31.0-31.9, adult: Secondary | ICD-10-CM | POA: Diagnosis not present

## 2015-07-10 DIAGNOSIS — M5416 Radiculopathy, lumbar region: Secondary | ICD-10-CM | POA: Diagnosis not present

## 2015-07-10 DIAGNOSIS — M4316 Spondylolisthesis, lumbar region: Secondary | ICD-10-CM | POA: Diagnosis not present

## 2015-07-10 DIAGNOSIS — M4806 Spinal stenosis, lumbar region: Secondary | ICD-10-CM | POA: Diagnosis not present

## 2015-07-10 DIAGNOSIS — M4126 Other idiopathic scoliosis, lumbar region: Secondary | ICD-10-CM | POA: Diagnosis not present

## 2015-08-08 DIAGNOSIS — J019 Acute sinusitis, unspecified: Secondary | ICD-10-CM | POA: Diagnosis not present

## 2015-08-08 DIAGNOSIS — H938X2 Other specified disorders of left ear: Secondary | ICD-10-CM | POA: Diagnosis not present

## 2015-08-08 DIAGNOSIS — R05 Cough: Secondary | ICD-10-CM | POA: Diagnosis not present

## 2015-10-02 DIAGNOSIS — L02439 Carbuncle of limb, unspecified: Secondary | ICD-10-CM | POA: Diagnosis not present

## 2015-11-06 ENCOUNTER — Other Ambulatory Visit: Payer: Self-pay

## 2015-11-07 DIAGNOSIS — L03111 Cellulitis of right axilla: Secondary | ICD-10-CM | POA: Diagnosis not present

## 2015-11-07 DIAGNOSIS — L02439 Carbuncle of limb, unspecified: Secondary | ICD-10-CM | POA: Diagnosis not present

## 2015-11-10 DIAGNOSIS — L02411 Cutaneous abscess of right axilla: Secondary | ICD-10-CM | POA: Diagnosis not present

## 2015-11-22 DIAGNOSIS — Z9889 Other specified postprocedural states: Secondary | ICD-10-CM | POA: Diagnosis not present

## 2015-12-19 DIAGNOSIS — E7800 Pure hypercholesterolemia, unspecified: Secondary | ICD-10-CM | POA: Diagnosis not present

## 2015-12-19 DIAGNOSIS — Z0000 Encounter for general adult medical examination without abnormal findings: Secondary | ICD-10-CM | POA: Diagnosis not present

## 2015-12-19 DIAGNOSIS — Z8546 Personal history of malignant neoplasm of prostate: Secondary | ICD-10-CM | POA: Diagnosis not present

## 2015-12-19 DIAGNOSIS — M109 Gout, unspecified: Secondary | ICD-10-CM | POA: Diagnosis not present

## 2015-12-19 DIAGNOSIS — K5730 Diverticulosis of large intestine without perforation or abscess without bleeding: Secondary | ICD-10-CM | POA: Diagnosis not present

## 2015-12-19 DIAGNOSIS — I10 Essential (primary) hypertension: Secondary | ICD-10-CM | POA: Diagnosis not present

## 2015-12-19 DIAGNOSIS — Z23 Encounter for immunization: Secondary | ICD-10-CM | POA: Diagnosis not present

## 2015-12-19 DIAGNOSIS — K219 Gastro-esophageal reflux disease without esophagitis: Secondary | ICD-10-CM | POA: Diagnosis not present

## 2016-04-03 IMAGING — MR MR LUMBAR SPINE W/O CM
5 series · 31 of 48 positions shown · non-contrast
Comparison: Radiography 11/07/2014.  MRI 04/22/2013.

CLINICAL DATA: Spondylosis without myelopathy. Chronic low back
pain and right leg pain, 30 years duration. Worsening of right leg
pain, numbness in weakness recently.

EXAM:
MRI LUMBAR SPINE WITHOUT CONTRAST
TECHNIQUE: Multiplanar, multisequence MR imaging of the lumbar spine was
performed. No intravenous contrast was administered.

[Series 3: T2 · sagittal · 4.0mm · 0.49mm/px · 6 of 12 slices shown (1 of 2)]
[im 1/12]
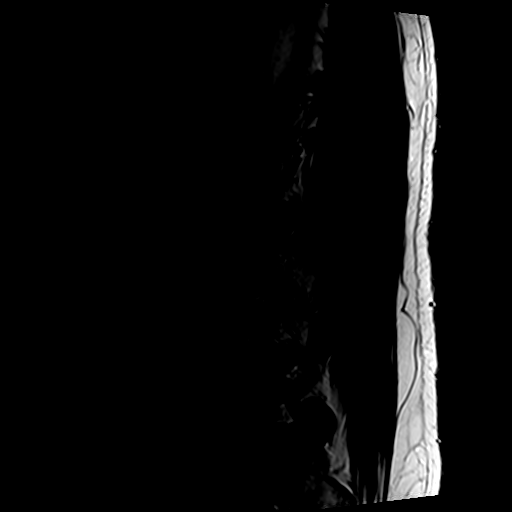
[im 3/12]
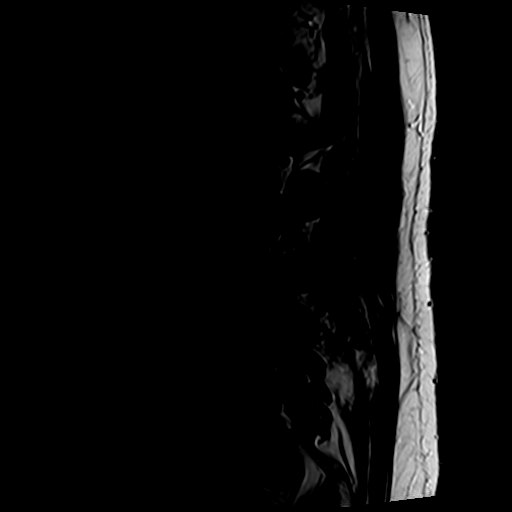
[im 5/12]
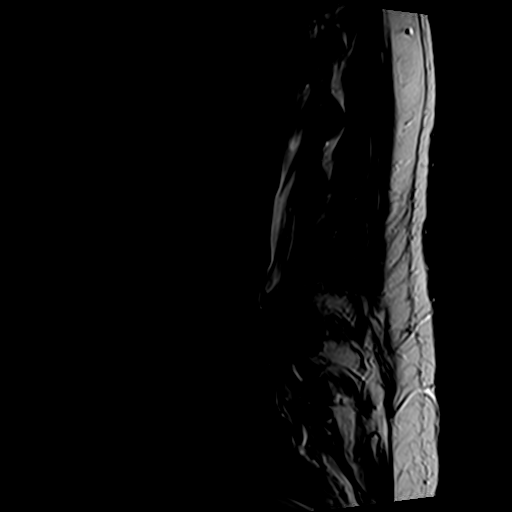
[im 7/12]
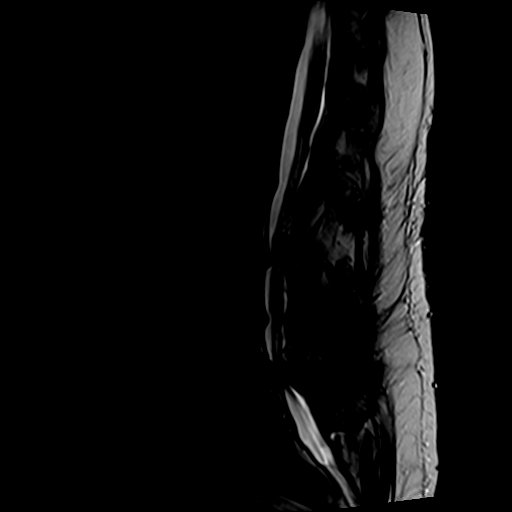
[im 9/12]
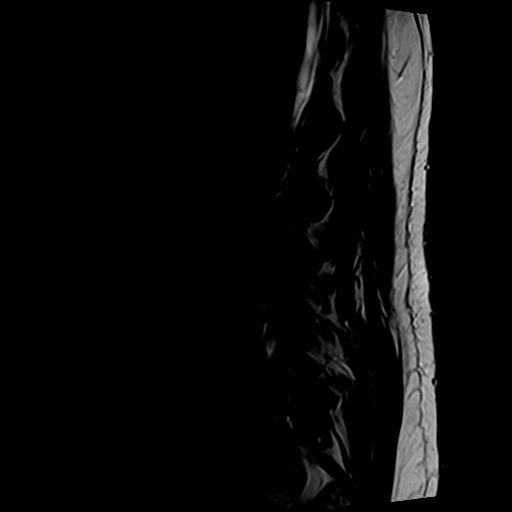
[im 12/12]
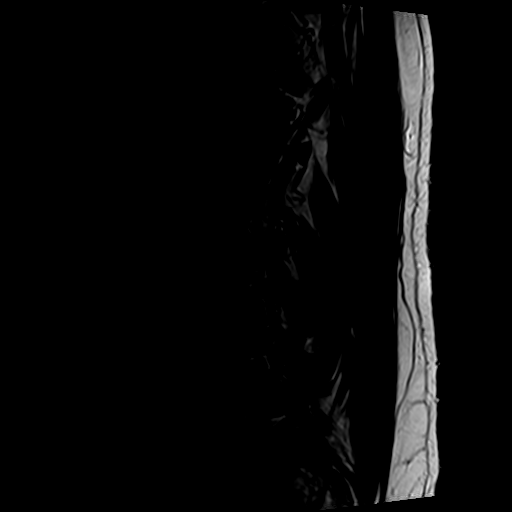

[Series 4: T1 · sagittal · 4.0mm · 0.49mm/px · 6 of 12 slices shown (1 of 2)]
[im 1/12]
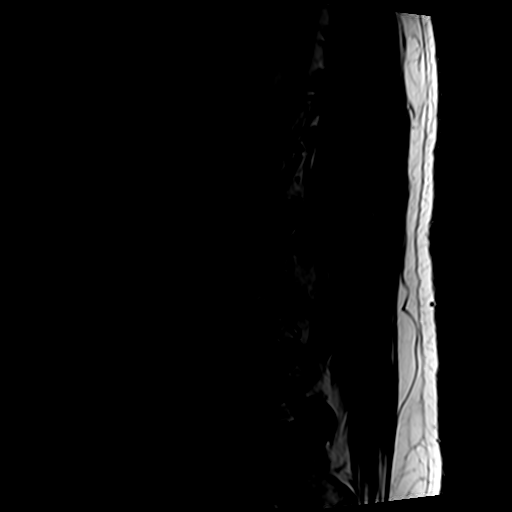
[im 3/12]
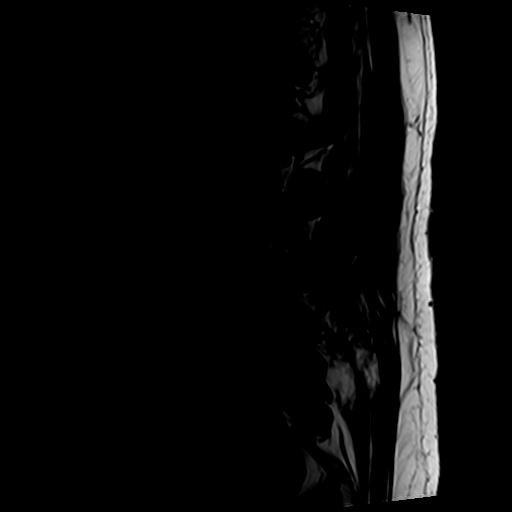
[im 5/12]
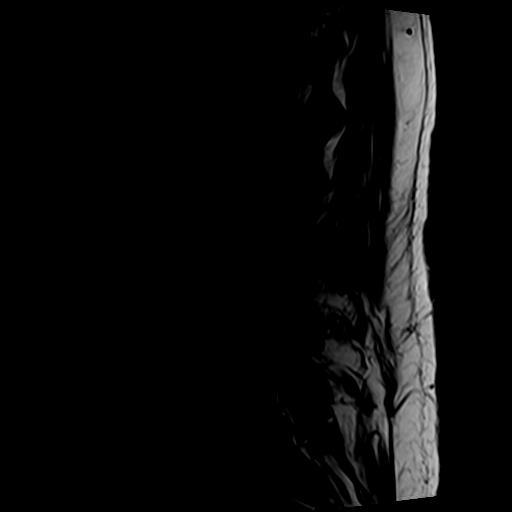
[im 7/12]
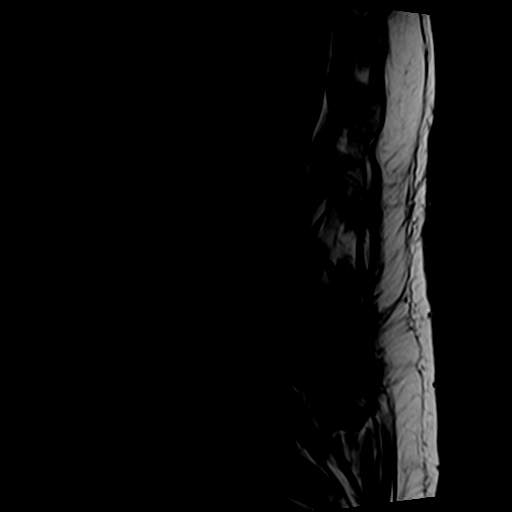
[im 9/12]
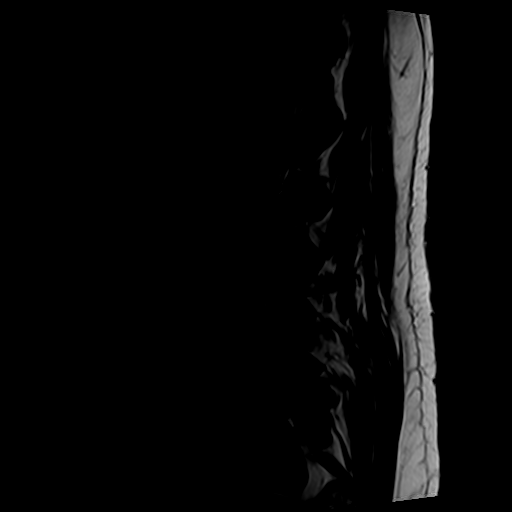
[im 12/12]
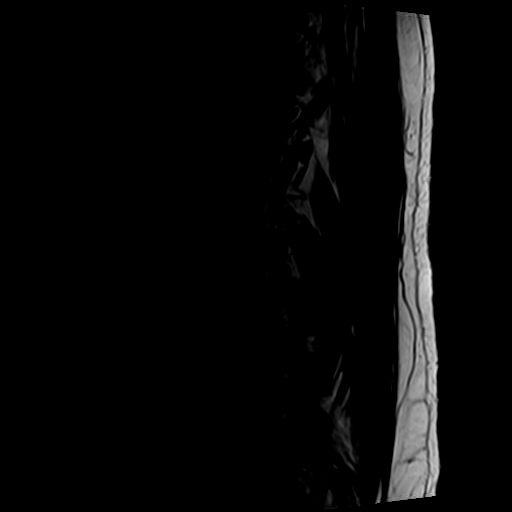

[Series 5: STIR · sagittal · 4.0mm · 0.49mm/px · 1 of 12 slices shown]
[im 1/12]
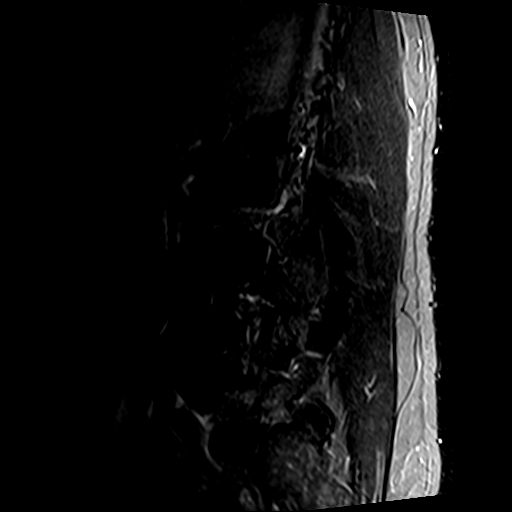

[Series 6: T2 · axial · 4.0mm · 0.74mm/px · z∈[-123,+42]mm · 9 of 30 slices shown (2 of 2)]
[im 1/30]
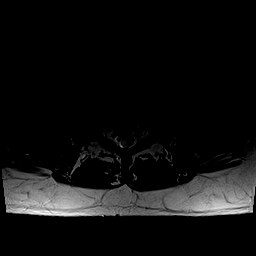
[im 5/30]
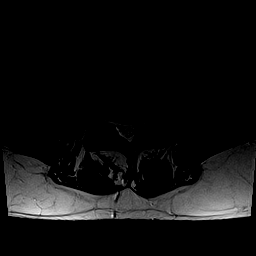
[im 9/30]
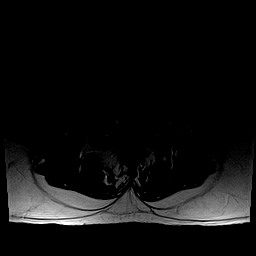
[im 13/30]
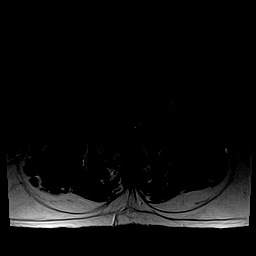
[im 15/30]
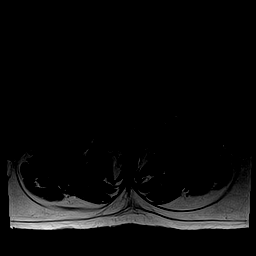
[im 17/30]
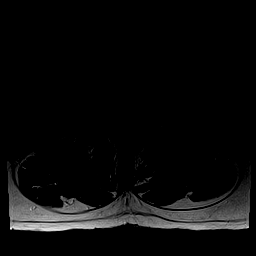
[im 21/30]
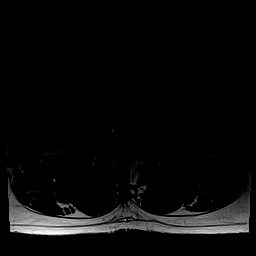
[im 25/30]
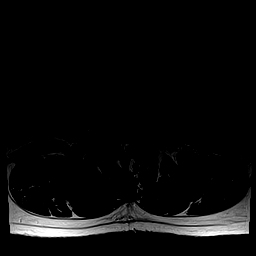
[im 30/30]
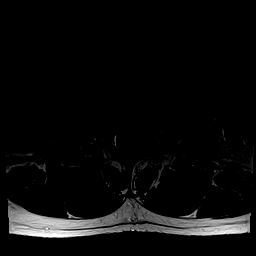

[Series 7: T1 · axial · 4.0mm · 0.74mm/px · z∈[-123,+42]mm · 9 of 30 slices shown (2 of 2)]
[im 1/30]
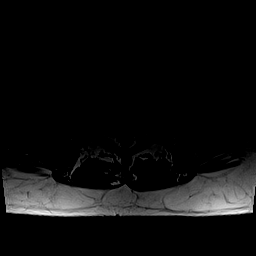
[im 5/30]
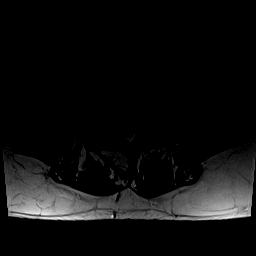
[im 9/30]
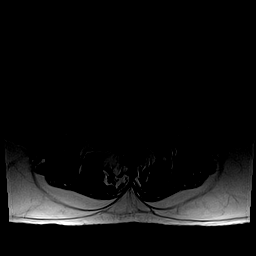
[im 13/30]
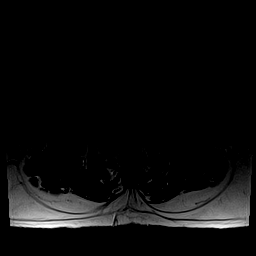
[im 15/30]
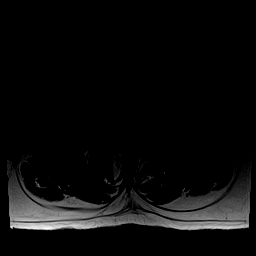
[im 17/30]
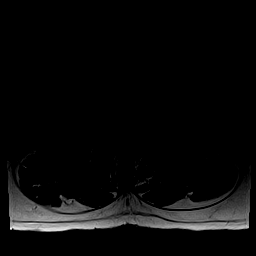
[im 21/30]
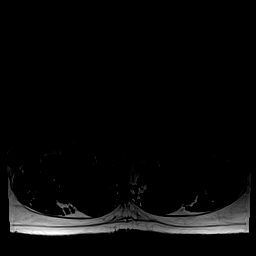
[im 25/30]
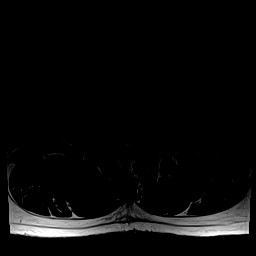
[im 30/30]
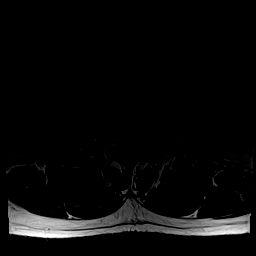

[31 of 48 positions shown; findings below may reference images not displayed]

FINDINGS: There is upper lumbar curvature convex to the right and lower lumbar
curvature convex to the left.

No disc pathology is seen at T11-12 or T12-L1. The L1-2 disc bulges
minimally but there is no stenosis.

L2-3: Disc degeneration with endplate osteophytes and bulging of the
disc more prominent on the left. Facet arthropathy on the left.
Stenosis of the left lateral recess and intervertebral foramen on
the left that could cause neural compression.

L3-4: Disc degeneration with endplate osteophytes and bulging of the
disc more prominent towards the right. Facet degeneration and
hypertrophy right more than left. Narrowing of both lateral recesses
and foramina, right more than left. Neural compression could occur
on the right.

L4-5: Advanced bilateral facet arthropathy with facet and
ligamentous hypertrophy. Anterolisthesis of 4 mm. Endplate
osteophytes and circumferential protrusion of disc material. Severe
stenosis of the spinal canal and of the foramina right worse than
left. Neural compression could occur on either side at this level.

L5-S1: Disc degeneration with endplate osteophytes and bulging of
the disc. Mild facet degeneration and hypertrophy. Mild narrowing of
the subarticular lateral recess on the left in the intervertebral
foramina bilaterally, but no visible neural compression.

Since the study of 2724, no change is appreciated.
IMPRESSION: Severe multifactorial spinal stenosis at the L4-5 level. Neural
compression is likely.

Right lateral recess and foraminal stenosis at L3-4 that could cause
neural compression.

Left lateral recess and neural foraminal stenosis at L2-3 that could
cause neural compression.

## 2016-07-02 DIAGNOSIS — I10 Essential (primary) hypertension: Secondary | ICD-10-CM | POA: Diagnosis not present

## 2016-07-02 DIAGNOSIS — E7800 Pure hypercholesterolemia, unspecified: Secondary | ICD-10-CM | POA: Diagnosis not present

## 2016-07-02 DIAGNOSIS — K219 Gastro-esophageal reflux disease without esophagitis: Secondary | ICD-10-CM | POA: Diagnosis not present

## 2016-07-02 DIAGNOSIS — M109 Gout, unspecified: Secondary | ICD-10-CM | POA: Diagnosis not present

## 2016-10-01 DIAGNOSIS — L820 Inflamed seborrheic keratosis: Secondary | ICD-10-CM | POA: Diagnosis not present

## 2016-10-01 DIAGNOSIS — L570 Actinic keratosis: Secondary | ICD-10-CM | POA: Diagnosis not present

## 2016-12-19 DIAGNOSIS — Z23 Encounter for immunization: Secondary | ICD-10-CM | POA: Diagnosis not present

## 2016-12-19 DIAGNOSIS — I10 Essential (primary) hypertension: Secondary | ICD-10-CM | POA: Diagnosis not present

## 2016-12-19 DIAGNOSIS — Z0000 Encounter for general adult medical examination without abnormal findings: Secondary | ICD-10-CM | POA: Diagnosis not present

## 2016-12-19 DIAGNOSIS — M109 Gout, unspecified: Secondary | ICD-10-CM | POA: Diagnosis not present

## 2016-12-19 DIAGNOSIS — Z125 Encounter for screening for malignant neoplasm of prostate: Secondary | ICD-10-CM | POA: Diagnosis not present

## 2016-12-19 DIAGNOSIS — E7800 Pure hypercholesterolemia, unspecified: Secondary | ICD-10-CM | POA: Diagnosis not present

## 2016-12-19 DIAGNOSIS — E0590 Thyrotoxicosis, unspecified without thyrotoxic crisis or storm: Secondary | ICD-10-CM | POA: Diagnosis not present

## 2016-12-19 DIAGNOSIS — K219 Gastro-esophageal reflux disease without esophagitis: Secondary | ICD-10-CM | POA: Diagnosis not present

## 2016-12-19 DIAGNOSIS — R5383 Other fatigue: Secondary | ICD-10-CM | POA: Diagnosis not present

## 2017-01-01 DIAGNOSIS — Z1211 Encounter for screening for malignant neoplasm of colon: Secondary | ICD-10-CM | POA: Diagnosis not present

## 2017-01-01 DIAGNOSIS — D122 Benign neoplasm of ascending colon: Secondary | ICD-10-CM | POA: Diagnosis not present

## 2017-01-01 DIAGNOSIS — K5730 Diverticulosis of large intestine without perforation or abscess without bleeding: Secondary | ICD-10-CM | POA: Diagnosis not present

## 2017-06-24 DIAGNOSIS — R739 Hyperglycemia, unspecified: Secondary | ICD-10-CM | POA: Diagnosis not present

## 2017-06-24 DIAGNOSIS — E0590 Thyrotoxicosis, unspecified without thyrotoxic crisis or storm: Secondary | ICD-10-CM | POA: Diagnosis not present

## 2017-06-24 DIAGNOSIS — M109 Gout, unspecified: Secondary | ICD-10-CM | POA: Diagnosis not present

## 2017-06-24 DIAGNOSIS — I10 Essential (primary) hypertension: Secondary | ICD-10-CM | POA: Diagnosis not present

## 2017-06-24 DIAGNOSIS — E7800 Pure hypercholesterolemia, unspecified: Secondary | ICD-10-CM | POA: Diagnosis not present

## 2017-10-08 DIAGNOSIS — L0390 Cellulitis, unspecified: Secondary | ICD-10-CM | POA: Diagnosis not present

## 2017-10-08 DIAGNOSIS — S91339A Puncture wound without foreign body, unspecified foot, initial encounter: Secondary | ICD-10-CM | POA: Diagnosis not present

## 2017-12-03 DIAGNOSIS — L821 Other seborrheic keratosis: Secondary | ICD-10-CM | POA: Diagnosis not present

## 2017-12-03 DIAGNOSIS — Z872 Personal history of diseases of the skin and subcutaneous tissue: Secondary | ICD-10-CM | POA: Diagnosis not present

## 2017-12-03 DIAGNOSIS — L570 Actinic keratosis: Secondary | ICD-10-CM | POA: Diagnosis not present

## 2017-12-11 DIAGNOSIS — Z23 Encounter for immunization: Secondary | ICD-10-CM | POA: Diagnosis not present

## 2017-12-23 DIAGNOSIS — Z8546 Personal history of malignant neoplasm of prostate: Secondary | ICD-10-CM | POA: Diagnosis not present

## 2017-12-23 DIAGNOSIS — E0590 Thyrotoxicosis, unspecified without thyrotoxic crisis or storm: Secondary | ICD-10-CM | POA: Diagnosis not present

## 2017-12-23 DIAGNOSIS — Z125 Encounter for screening for malignant neoplasm of prostate: Secondary | ICD-10-CM | POA: Diagnosis not present

## 2017-12-23 DIAGNOSIS — E7800 Pure hypercholesterolemia, unspecified: Secondary | ICD-10-CM | POA: Diagnosis not present

## 2017-12-23 DIAGNOSIS — M109 Gout, unspecified: Secondary | ICD-10-CM | POA: Diagnosis not present

## 2017-12-23 DIAGNOSIS — I10 Essential (primary) hypertension: Secondary | ICD-10-CM | POA: Diagnosis not present

## 2017-12-23 DIAGNOSIS — R5383 Other fatigue: Secondary | ICD-10-CM | POA: Diagnosis not present

## 2017-12-23 DIAGNOSIS — K219 Gastro-esophageal reflux disease without esophagitis: Secondary | ICD-10-CM | POA: Diagnosis not present

## 2017-12-23 DIAGNOSIS — Z0000 Encounter for general adult medical examination without abnormal findings: Secondary | ICD-10-CM | POA: Diagnosis not present

## 2018-05-19 DIAGNOSIS — M25561 Pain in right knee: Secondary | ICD-10-CM | POA: Diagnosis not present

## 2018-05-19 DIAGNOSIS — M25562 Pain in left knee: Secondary | ICD-10-CM | POA: Diagnosis not present

## 2018-06-10 DIAGNOSIS — E0590 Thyrotoxicosis, unspecified without thyrotoxic crisis or storm: Secondary | ICD-10-CM | POA: Diagnosis not present

## 2018-06-10 DIAGNOSIS — M109 Gout, unspecified: Secondary | ICD-10-CM | POA: Diagnosis not present

## 2018-06-10 DIAGNOSIS — I10 Essential (primary) hypertension: Secondary | ICD-10-CM | POA: Diagnosis not present

## 2018-06-10 DIAGNOSIS — E7800 Pure hypercholesterolemia, unspecified: Secondary | ICD-10-CM | POA: Diagnosis not present

## 2018-07-21 DIAGNOSIS — M25561 Pain in right knee: Secondary | ICD-10-CM | POA: Diagnosis not present

## 2018-07-21 DIAGNOSIS — M25562 Pain in left knee: Secondary | ICD-10-CM | POA: Diagnosis not present

## 2018-10-08 ENCOUNTER — Other Ambulatory Visit: Payer: Self-pay

## 2018-11-26 DIAGNOSIS — Z23 Encounter for immunization: Secondary | ICD-10-CM | POA: Diagnosis not present

## 2018-12-09 DIAGNOSIS — Z872 Personal history of diseases of the skin and subcutaneous tissue: Secondary | ICD-10-CM | POA: Diagnosis not present

## 2018-12-09 DIAGNOSIS — L814 Other melanin hyperpigmentation: Secondary | ICD-10-CM | POA: Diagnosis not present

## 2018-12-09 DIAGNOSIS — L570 Actinic keratosis: Secondary | ICD-10-CM | POA: Diagnosis not present

## 2018-12-29 DIAGNOSIS — K219 Gastro-esophageal reflux disease without esophagitis: Secondary | ICD-10-CM | POA: Diagnosis not present

## 2018-12-29 DIAGNOSIS — Z0000 Encounter for general adult medical examination without abnormal findings: Secondary | ICD-10-CM | POA: Diagnosis not present

## 2018-12-29 DIAGNOSIS — I10 Essential (primary) hypertension: Secondary | ICD-10-CM | POA: Diagnosis not present

## 2018-12-29 DIAGNOSIS — E0590 Thyrotoxicosis, unspecified without thyrotoxic crisis or storm: Secondary | ICD-10-CM | POA: Diagnosis not present

## 2018-12-29 DIAGNOSIS — M109 Gout, unspecified: Secondary | ICD-10-CM | POA: Diagnosis not present

## 2018-12-29 DIAGNOSIS — E7800 Pure hypercholesterolemia, unspecified: Secondary | ICD-10-CM | POA: Diagnosis not present

## 2018-12-29 DIAGNOSIS — J309 Allergic rhinitis, unspecified: Secondary | ICD-10-CM | POA: Diagnosis not present

## 2018-12-29 DIAGNOSIS — Z125 Encounter for screening for malignant neoplasm of prostate: Secondary | ICD-10-CM | POA: Diagnosis not present

## 2018-12-29 DIAGNOSIS — R5383 Other fatigue: Secondary | ICD-10-CM | POA: Diagnosis not present

## 2019-01-12 DIAGNOSIS — J329 Chronic sinusitis, unspecified: Secondary | ICD-10-CM | POA: Diagnosis not present

## 2019-04-21 DIAGNOSIS — M25561 Pain in right knee: Secondary | ICD-10-CM | POA: Diagnosis not present

## 2019-04-21 DIAGNOSIS — M25562 Pain in left knee: Secondary | ICD-10-CM | POA: Diagnosis not present

## 2019-06-28 DIAGNOSIS — E0590 Thyrotoxicosis, unspecified without thyrotoxic crisis or storm: Secondary | ICD-10-CM | POA: Diagnosis not present

## 2019-06-28 DIAGNOSIS — E7800 Pure hypercholesterolemia, unspecified: Secondary | ICD-10-CM | POA: Diagnosis not present

## 2019-06-28 DIAGNOSIS — M109 Gout, unspecified: Secondary | ICD-10-CM | POA: Diagnosis not present

## 2019-06-28 DIAGNOSIS — I10 Essential (primary) hypertension: Secondary | ICD-10-CM | POA: Diagnosis not present

## 2019-07-11 DIAGNOSIS — H6693 Otitis media, unspecified, bilateral: Secondary | ICD-10-CM | POA: Diagnosis not present

## 2019-12-13 DIAGNOSIS — L570 Actinic keratosis: Secondary | ICD-10-CM | POA: Diagnosis not present

## 2019-12-13 DIAGNOSIS — L578 Other skin changes due to chronic exposure to nonionizing radiation: Secondary | ICD-10-CM | POA: Diagnosis not present

## 2019-12-13 DIAGNOSIS — L821 Other seborrheic keratosis: Secondary | ICD-10-CM | POA: Diagnosis not present

## 2020-01-05 DIAGNOSIS — E0590 Thyrotoxicosis, unspecified without thyrotoxic crisis or storm: Secondary | ICD-10-CM | POA: Diagnosis not present

## 2020-01-05 DIAGNOSIS — I10 Essential (primary) hypertension: Secondary | ICD-10-CM | POA: Diagnosis not present

## 2020-01-05 DIAGNOSIS — Z125 Encounter for screening for malignant neoplasm of prostate: Secondary | ICD-10-CM | POA: Diagnosis not present

## 2020-01-05 DIAGNOSIS — R5383 Other fatigue: Secondary | ICD-10-CM | POA: Diagnosis not present

## 2020-01-05 DIAGNOSIS — E7800 Pure hypercholesterolemia, unspecified: Secondary | ICD-10-CM | POA: Diagnosis not present

## 2020-01-05 DIAGNOSIS — M109 Gout, unspecified: Secondary | ICD-10-CM | POA: Diagnosis not present

## 2020-01-05 DIAGNOSIS — Z23 Encounter for immunization: Secondary | ICD-10-CM | POA: Diagnosis not present

## 2020-01-05 DIAGNOSIS — Z0000 Encounter for general adult medical examination without abnormal findings: Secondary | ICD-10-CM | POA: Diagnosis not present

## 2020-02-23 DIAGNOSIS — H40003 Preglaucoma, unspecified, bilateral: Secondary | ICD-10-CM | POA: Diagnosis not present

## 2020-02-23 DIAGNOSIS — H02834 Dermatochalasis of left upper eyelid: Secondary | ICD-10-CM | POA: Diagnosis not present

## 2020-02-23 DIAGNOSIS — H52223 Regular astigmatism, bilateral: Secondary | ICD-10-CM | POA: Diagnosis not present

## 2020-02-23 DIAGNOSIS — H02831 Dermatochalasis of right upper eyelid: Secondary | ICD-10-CM | POA: Diagnosis not present

## 2020-02-23 DIAGNOSIS — H527 Unspecified disorder of refraction: Secondary | ICD-10-CM | POA: Diagnosis not present

## 2020-02-23 DIAGNOSIS — H25813 Combined forms of age-related cataract, bilateral: Secondary | ICD-10-CM | POA: Diagnosis not present

## 2020-06-22 DIAGNOSIS — H52203 Unspecified astigmatism, bilateral: Secondary | ICD-10-CM | POA: Diagnosis not present

## 2020-06-22 DIAGNOSIS — H25813 Combined forms of age-related cataract, bilateral: Secondary | ICD-10-CM | POA: Diagnosis not present

## 2020-06-22 DIAGNOSIS — E0590 Thyrotoxicosis, unspecified without thyrotoxic crisis or storm: Secondary | ICD-10-CM | POA: Diagnosis not present

## 2020-06-22 DIAGNOSIS — E7800 Pure hypercholesterolemia, unspecified: Secondary | ICD-10-CM | POA: Diagnosis not present

## 2020-06-22 DIAGNOSIS — M109 Gout, unspecified: Secondary | ICD-10-CM | POA: Diagnosis not present

## 2020-06-22 DIAGNOSIS — I10 Essential (primary) hypertension: Secondary | ICD-10-CM | POA: Diagnosis not present

## 2020-06-29 DIAGNOSIS — Z7982 Long term (current) use of aspirin: Secondary | ICD-10-CM | POA: Diagnosis not present

## 2020-06-29 DIAGNOSIS — I10 Essential (primary) hypertension: Secondary | ICD-10-CM | POA: Diagnosis not present

## 2020-06-29 DIAGNOSIS — Z79899 Other long term (current) drug therapy: Secondary | ICD-10-CM | POA: Diagnosis not present

## 2020-06-29 DIAGNOSIS — Z96653 Presence of artificial knee joint, bilateral: Secondary | ICD-10-CM | POA: Diagnosis not present

## 2020-06-29 DIAGNOSIS — H02834 Dermatochalasis of left upper eyelid: Secondary | ICD-10-CM | POA: Diagnosis not present

## 2020-06-29 DIAGNOSIS — H25812 Combined forms of age-related cataract, left eye: Secondary | ICD-10-CM | POA: Diagnosis not present

## 2020-06-29 DIAGNOSIS — G4733 Obstructive sleep apnea (adult) (pediatric): Secondary | ICD-10-CM | POA: Diagnosis not present

## 2020-06-29 DIAGNOSIS — K219 Gastro-esophageal reflux disease without esophagitis: Secondary | ICD-10-CM | POA: Diagnosis not present

## 2020-06-29 DIAGNOSIS — H40003 Preglaucoma, unspecified, bilateral: Secondary | ICD-10-CM | POA: Diagnosis not present

## 2020-06-29 DIAGNOSIS — H02831 Dermatochalasis of right upper eyelid: Secondary | ICD-10-CM | POA: Diagnosis not present

## 2020-06-29 DIAGNOSIS — E0590 Thyrotoxicosis, unspecified without thyrotoxic crisis or storm: Secondary | ICD-10-CM | POA: Diagnosis not present

## 2020-06-29 DIAGNOSIS — Z96611 Presence of right artificial shoulder joint: Secondary | ICD-10-CM | POA: Diagnosis not present

## 2020-06-30 DIAGNOSIS — H52201 Unspecified astigmatism, right eye: Secondary | ICD-10-CM | POA: Diagnosis not present

## 2020-06-30 DIAGNOSIS — Z961 Presence of intraocular lens: Secondary | ICD-10-CM | POA: Diagnosis not present

## 2020-06-30 DIAGNOSIS — H25811 Combined forms of age-related cataract, right eye: Secondary | ICD-10-CM | POA: Diagnosis not present

## 2020-07-06 DIAGNOSIS — K219 Gastro-esophageal reflux disease without esophagitis: Secondary | ICD-10-CM | POA: Diagnosis not present

## 2020-07-06 DIAGNOSIS — H25811 Combined forms of age-related cataract, right eye: Secondary | ICD-10-CM | POA: Diagnosis not present

## 2020-07-06 DIAGNOSIS — Z79899 Other long term (current) drug therapy: Secondary | ICD-10-CM | POA: Diagnosis not present

## 2020-07-06 DIAGNOSIS — H02831 Dermatochalasis of right upper eyelid: Secondary | ICD-10-CM | POA: Diagnosis not present

## 2020-07-06 DIAGNOSIS — G4733 Obstructive sleep apnea (adult) (pediatric): Secondary | ICD-10-CM | POA: Diagnosis not present

## 2020-07-06 DIAGNOSIS — Z7982 Long term (current) use of aspirin: Secondary | ICD-10-CM | POA: Diagnosis not present

## 2020-07-06 DIAGNOSIS — H40003 Preglaucoma, unspecified, bilateral: Secondary | ICD-10-CM | POA: Diagnosis not present

## 2020-07-06 DIAGNOSIS — E785 Hyperlipidemia, unspecified: Secondary | ICD-10-CM | POA: Diagnosis not present

## 2020-07-06 DIAGNOSIS — H25813 Combined forms of age-related cataract, bilateral: Secondary | ICD-10-CM | POA: Diagnosis not present

## 2020-07-06 DIAGNOSIS — H02834 Dermatochalasis of left upper eyelid: Secondary | ICD-10-CM | POA: Diagnosis not present

## 2020-07-06 DIAGNOSIS — E0590 Thyrotoxicosis, unspecified without thyrotoxic crisis or storm: Secondary | ICD-10-CM | POA: Diagnosis not present

## 2020-07-06 DIAGNOSIS — I10 Essential (primary) hypertension: Secondary | ICD-10-CM | POA: Diagnosis not present

## 2020-07-07 DIAGNOSIS — Z961 Presence of intraocular lens: Secondary | ICD-10-CM | POA: Diagnosis not present

## 2020-12-20 DIAGNOSIS — L821 Other seborrheic keratosis: Secondary | ICD-10-CM | POA: Diagnosis not present

## 2020-12-20 DIAGNOSIS — L570 Actinic keratosis: Secondary | ICD-10-CM | POA: Diagnosis not present

## 2020-12-20 DIAGNOSIS — L578 Other skin changes due to chronic exposure to nonionizing radiation: Secondary | ICD-10-CM | POA: Diagnosis not present

## 2020-12-20 DIAGNOSIS — L814 Other melanin hyperpigmentation: Secondary | ICD-10-CM | POA: Diagnosis not present

## 2020-12-20 DIAGNOSIS — X32XXXS Exposure to sunlight, sequela: Secondary | ICD-10-CM | POA: Diagnosis not present

## 2021-01-03 DIAGNOSIS — E0590 Thyrotoxicosis, unspecified without thyrotoxic crisis or storm: Secondary | ICD-10-CM | POA: Diagnosis not present

## 2021-01-03 DIAGNOSIS — Z0000 Encounter for general adult medical examination without abnormal findings: Secondary | ICD-10-CM | POA: Diagnosis not present

## 2021-01-03 DIAGNOSIS — M109 Gout, unspecified: Secondary | ICD-10-CM | POA: Diagnosis not present

## 2021-01-03 DIAGNOSIS — I10 Essential (primary) hypertension: Secondary | ICD-10-CM | POA: Diagnosis not present

## 2021-01-03 DIAGNOSIS — E7800 Pure hypercholesterolemia, unspecified: Secondary | ICD-10-CM | POA: Diagnosis not present

## 2021-07-03 DIAGNOSIS — M109 Gout, unspecified: Secondary | ICD-10-CM | POA: Diagnosis not present

## 2021-07-03 DIAGNOSIS — I10 Essential (primary) hypertension: Secondary | ICD-10-CM | POA: Diagnosis not present

## 2021-07-03 DIAGNOSIS — E0590 Thyrotoxicosis, unspecified without thyrotoxic crisis or storm: Secondary | ICD-10-CM | POA: Diagnosis not present

## 2021-07-03 DIAGNOSIS — E7800 Pure hypercholesterolemia, unspecified: Secondary | ICD-10-CM | POA: Diagnosis not present

## 2021-07-04 DIAGNOSIS — I10 Essential (primary) hypertension: Secondary | ICD-10-CM | POA: Diagnosis not present

## 2021-07-04 DIAGNOSIS — E7800 Pure hypercholesterolemia, unspecified: Secondary | ICD-10-CM | POA: Diagnosis not present

## 2021-07-04 DIAGNOSIS — E0590 Thyrotoxicosis, unspecified without thyrotoxic crisis or storm: Secondary | ICD-10-CM | POA: Diagnosis not present

## 2021-07-04 DIAGNOSIS — R635 Abnormal weight gain: Secondary | ICD-10-CM | POA: Diagnosis not present

## 2021-10-18 ENCOUNTER — Other Ambulatory Visit (HOSPITAL_BASED_OUTPATIENT_CLINIC_OR_DEPARTMENT_OTHER): Payer: Self-pay | Admitting: Emergency Medicine

## 2021-10-18 DIAGNOSIS — K219 Gastro-esophageal reflux disease without esophagitis: Secondary | ICD-10-CM | POA: Diagnosis not present

## 2021-10-18 DIAGNOSIS — R7303 Prediabetes: Secondary | ICD-10-CM | POA: Diagnosis not present

## 2021-10-18 DIAGNOSIS — E785 Hyperlipidemia, unspecified: Secondary | ICD-10-CM | POA: Diagnosis not present

## 2021-10-18 DIAGNOSIS — Z122 Encounter for screening for malignant neoplasm of respiratory organs: Secondary | ICD-10-CM | POA: Diagnosis not present

## 2021-10-18 DIAGNOSIS — I10 Essential (primary) hypertension: Secondary | ICD-10-CM | POA: Diagnosis not present

## 2021-10-18 DIAGNOSIS — Z8546 Personal history of malignant neoplasm of prostate: Secondary | ICD-10-CM | POA: Diagnosis not present

## 2021-10-18 DIAGNOSIS — M1990 Unspecified osteoarthritis, unspecified site: Secondary | ICD-10-CM | POA: Diagnosis not present

## 2021-10-18 DIAGNOSIS — Z125 Encounter for screening for malignant neoplasm of prostate: Secondary | ICD-10-CM | POA: Diagnosis not present

## 2021-10-18 DIAGNOSIS — E0590 Thyrotoxicosis, unspecified without thyrotoxic crisis or storm: Secondary | ICD-10-CM | POA: Diagnosis not present

## 2021-12-19 DIAGNOSIS — X32XXXS Exposure to sunlight, sequela: Secondary | ICD-10-CM | POA: Diagnosis not present

## 2021-12-19 DIAGNOSIS — L570 Actinic keratosis: Secondary | ICD-10-CM | POA: Diagnosis not present

## 2021-12-19 DIAGNOSIS — L821 Other seborrheic keratosis: Secondary | ICD-10-CM | POA: Diagnosis not present

## 2021-12-19 DIAGNOSIS — L814 Other melanin hyperpigmentation: Secondary | ICD-10-CM | POA: Diagnosis not present

## 2021-12-19 DIAGNOSIS — L578 Other skin changes due to chronic exposure to nonionizing radiation: Secondary | ICD-10-CM | POA: Diagnosis not present

## 2021-12-19 DIAGNOSIS — E669 Obesity, unspecified: Secondary | ICD-10-CM | POA: Diagnosis not present

## 2022-02-05 DIAGNOSIS — Z86010 Personal history of colonic polyps: Secondary | ICD-10-CM | POA: Diagnosis not present

## 2022-02-05 DIAGNOSIS — K5730 Diverticulosis of large intestine without perforation or abscess without bleeding: Secondary | ICD-10-CM | POA: Diagnosis not present

## 2022-02-05 DIAGNOSIS — Z1211 Encounter for screening for malignant neoplasm of colon: Secondary | ICD-10-CM | POA: Diagnosis not present

## 2022-02-05 DIAGNOSIS — D122 Benign neoplasm of ascending colon: Secondary | ICD-10-CM | POA: Diagnosis not present

## 2022-03-06 DIAGNOSIS — J019 Acute sinusitis, unspecified: Secondary | ICD-10-CM | POA: Diagnosis not present

## 2022-03-06 DIAGNOSIS — R0981 Nasal congestion: Secondary | ICD-10-CM | POA: Diagnosis not present

## 2022-03-06 DIAGNOSIS — R051 Acute cough: Secondary | ICD-10-CM | POA: Diagnosis not present
